# Patient Record
Sex: Female | Born: 1945
Health system: Southern US, Community
[De-identification: ages and names within clinical notes are randomized; demographics above are authoritative.]

## PROBLEM LIST (undated history)

## (undated) DIAGNOSIS — I499 Cardiac arrhythmia, unspecified: Secondary | ICD-10-CM

## (undated) DIAGNOSIS — M1990 Unspecified osteoarthritis, unspecified site: Secondary | ICD-10-CM

## (undated) HISTORY — PX: CATARACT EXTRACTION, BILATERAL: SHX1313

## (undated) HISTORY — PX: ABDOMINAL HYSTERECTOMY: SHX81

## (undated) HISTORY — PX: TUBAL LIGATION: SHX77

---

## 1998-06-06 ENCOUNTER — Ambulatory Visit (HOSPITAL_COMMUNITY): Admission: RE | Admit: 1998-06-06 | Discharge: 1998-06-06 | Payer: Self-pay | Admitting: Internal Medicine

## 1998-06-06 ENCOUNTER — Encounter: Payer: Self-pay | Admitting: Internal Medicine

## 1999-07-03 ENCOUNTER — Encounter: Payer: Self-pay | Admitting: Internal Medicine

## 1999-07-03 ENCOUNTER — Encounter: Admission: RE | Admit: 1999-07-03 | Discharge: 1999-07-03 | Payer: Self-pay | Admitting: Internal Medicine

## 2000-07-11 ENCOUNTER — Encounter: Admission: RE | Admit: 2000-07-11 | Discharge: 2000-07-11 | Payer: Self-pay | Admitting: Internal Medicine

## 2000-07-11 ENCOUNTER — Encounter: Payer: Self-pay | Admitting: Internal Medicine

## 2001-07-12 ENCOUNTER — Encounter: Admission: RE | Admit: 2001-07-12 | Discharge: 2001-07-12 | Payer: Self-pay | Admitting: Internal Medicine

## 2001-07-12 ENCOUNTER — Encounter: Payer: Self-pay | Admitting: Internal Medicine

## 2002-08-08 ENCOUNTER — Encounter: Admission: RE | Admit: 2002-08-08 | Discharge: 2002-08-08 | Payer: Self-pay | Admitting: Internal Medicine

## 2002-08-08 ENCOUNTER — Encounter: Payer: Self-pay | Admitting: Internal Medicine

## 2003-10-28 ENCOUNTER — Encounter: Admission: RE | Admit: 2003-10-28 | Discharge: 2003-10-28 | Payer: Self-pay | Admitting: Internal Medicine

## 2004-11-27 ENCOUNTER — Encounter: Admission: RE | Admit: 2004-11-27 | Discharge: 2004-11-27 | Payer: Self-pay | Admitting: Internal Medicine

## 2005-11-29 ENCOUNTER — Encounter: Admission: RE | Admit: 2005-11-29 | Discharge: 2005-11-29 | Payer: Self-pay | Admitting: Internal Medicine

## 2006-12-21 ENCOUNTER — Encounter: Admission: RE | Admit: 2006-12-21 | Discharge: 2006-12-21 | Payer: Self-pay | Admitting: Internal Medicine

## 2007-12-27 ENCOUNTER — Encounter: Admission: RE | Admit: 2007-12-27 | Discharge: 2007-12-27 | Payer: Self-pay | Admitting: Internal Medicine

## 2009-01-01 ENCOUNTER — Encounter: Admission: RE | Admit: 2009-01-01 | Discharge: 2009-01-01 | Payer: Self-pay | Admitting: Internal Medicine

## 2010-01-07 ENCOUNTER — Encounter
Admission: RE | Admit: 2010-01-07 | Discharge: 2010-01-07 | Payer: Self-pay | Source: Home / Self Care | Attending: Internal Medicine | Admitting: Internal Medicine

## 2010-12-02 ENCOUNTER — Other Ambulatory Visit: Payer: Self-pay | Admitting: Internal Medicine

## 2010-12-02 DIAGNOSIS — Z1231 Encounter for screening mammogram for malignant neoplasm of breast: Secondary | ICD-10-CM

## 2011-01-13 ENCOUNTER — Ambulatory Visit
Admission: RE | Admit: 2011-01-13 | Discharge: 2011-01-13 | Disposition: A | Discharge status: Home / Self Care | Payer: PRIVATE HEALTH INSURANCE | Source: Ambulatory Visit | Attending: Internal Medicine | Admitting: Internal Medicine

## 2011-01-13 DIAGNOSIS — Z1231 Encounter for screening mammogram for malignant neoplasm of breast: Secondary | ICD-10-CM

## 2011-01-21 ENCOUNTER — Ambulatory Visit: Payer: Self-pay

## 2011-06-18 DIAGNOSIS — Z1331 Encounter for screening for depression: Secondary | ICD-10-CM | POA: Diagnosis not present

## 2011-06-18 DIAGNOSIS — Z0000 Encounter for general adult medical examination without abnormal findings: Secondary | ICD-10-CM | POA: Diagnosis not present

## 2011-06-18 DIAGNOSIS — Z23 Encounter for immunization: Secondary | ICD-10-CM | POA: Diagnosis not present

## 2011-06-18 DIAGNOSIS — I10 Essential (primary) hypertension: Secondary | ICD-10-CM | POA: Diagnosis not present

## 2011-11-17 DIAGNOSIS — Z23 Encounter for immunization: Secondary | ICD-10-CM | POA: Diagnosis not present

## 2011-12-03 DIAGNOSIS — I10 Essential (primary) hypertension: Secondary | ICD-10-CM | POA: Diagnosis not present

## 2011-12-08 ENCOUNTER — Other Ambulatory Visit: Payer: Self-pay | Admitting: Internal Medicine

## 2011-12-08 DIAGNOSIS — Z1231 Encounter for screening mammogram for malignant neoplasm of breast: Secondary | ICD-10-CM

## 2012-01-11 DIAGNOSIS — R350 Frequency of micturition: Secondary | ICD-10-CM | POA: Diagnosis not present

## 2012-01-11 DIAGNOSIS — J029 Acute pharyngitis, unspecified: Secondary | ICD-10-CM | POA: Diagnosis not present

## 2012-01-14 ENCOUNTER — Ambulatory Visit
Admission: RE | Admit: 2012-01-14 | Discharge: 2012-01-14 | Disposition: A | Discharge status: Home / Self Care | Payer: Medicare Other | Source: Ambulatory Visit | Attending: Internal Medicine | Admitting: Internal Medicine

## 2012-01-14 DIAGNOSIS — Z1231 Encounter for screening mammogram for malignant neoplasm of breast: Secondary | ICD-10-CM | POA: Diagnosis not present

## 2012-06-01 DIAGNOSIS — I10 Essential (primary) hypertension: Secondary | ICD-10-CM | POA: Diagnosis not present

## 2012-07-18 DIAGNOSIS — Z961 Presence of intraocular lens: Secondary | ICD-10-CM | POA: Diagnosis not present

## 2012-07-18 DIAGNOSIS — H18419 Arcus senilis, unspecified eye: Secondary | ICD-10-CM | POA: Diagnosis not present

## 2012-10-18 DIAGNOSIS — Z23 Encounter for immunization: Secondary | ICD-10-CM | POA: Diagnosis not present

## 2012-12-04 DIAGNOSIS — I10 Essential (primary) hypertension: Secondary | ICD-10-CM | POA: Diagnosis not present

## 2012-12-20 ENCOUNTER — Other Ambulatory Visit: Payer: Self-pay

## 2012-12-20 DIAGNOSIS — Z1231 Encounter for screening mammogram for malignant neoplasm of breast: Secondary | ICD-10-CM

## 2013-01-19 ENCOUNTER — Ambulatory Visit
Admission: RE | Admit: 2013-01-19 | Discharge: 2013-01-19 | Disposition: A | Discharge status: Home / Self Care | Payer: Medicare Other | Source: Ambulatory Visit

## 2013-01-19 ENCOUNTER — Other Ambulatory Visit: Payer: Self-pay

## 2013-01-19 DIAGNOSIS — Z1231 Encounter for screening mammogram for malignant neoplasm of breast: Secondary | ICD-10-CM

## 2013-06-04 DIAGNOSIS — I10 Essential (primary) hypertension: Secondary | ICD-10-CM | POA: Diagnosis not present

## 2013-06-04 DIAGNOSIS — R6883 Chills (without fever): Secondary | ICD-10-CM | POA: Diagnosis not present

## 2013-06-04 DIAGNOSIS — Z0000 Encounter for general adult medical examination without abnormal findings: Secondary | ICD-10-CM | POA: Diagnosis not present

## 2013-06-04 DIAGNOSIS — Z23 Encounter for immunization: Secondary | ICD-10-CM | POA: Diagnosis not present

## 2013-06-22 DIAGNOSIS — Z780 Asymptomatic menopausal state: Secondary | ICD-10-CM | POA: Diagnosis not present

## 2013-07-17 DIAGNOSIS — H02839 Dermatochalasis of unspecified eye, unspecified eyelid: Secondary | ICD-10-CM | POA: Diagnosis not present

## 2013-07-17 DIAGNOSIS — H18419 Arcus senilis, unspecified eye: Secondary | ICD-10-CM | POA: Diagnosis not present

## 2013-07-17 DIAGNOSIS — H04129 Dry eye syndrome of unspecified lacrimal gland: Secondary | ICD-10-CM | POA: Diagnosis not present

## 2013-07-17 DIAGNOSIS — Z961 Presence of intraocular lens: Secondary | ICD-10-CM | POA: Diagnosis not present

## 2013-10-24 DIAGNOSIS — Z23 Encounter for immunization: Secondary | ICD-10-CM | POA: Diagnosis not present

## 2013-11-26 DIAGNOSIS — I10 Essential (primary) hypertension: Secondary | ICD-10-CM | POA: Diagnosis not present

## 2013-11-26 DIAGNOSIS — R079 Chest pain, unspecified: Secondary | ICD-10-CM | POA: Diagnosis not present

## 2013-12-24 ENCOUNTER — Other Ambulatory Visit: Payer: Self-pay

## 2013-12-24 DIAGNOSIS — Z1231 Encounter for screening mammogram for malignant neoplasm of breast: Secondary | ICD-10-CM

## 2014-01-21 ENCOUNTER — Ambulatory Visit
Admission: RE | Admit: 2014-01-21 | Discharge: 2014-01-21 | Disposition: A | Discharge status: Home / Self Care | Payer: Medicare Other | Source: Ambulatory Visit

## 2014-01-21 DIAGNOSIS — Z1231 Encounter for screening mammogram for malignant neoplasm of breast: Secondary | ICD-10-CM | POA: Diagnosis not present

## 2014-06-13 DIAGNOSIS — E669 Obesity, unspecified: Secondary | ICD-10-CM | POA: Diagnosis not present

## 2014-06-13 DIAGNOSIS — Z23 Encounter for immunization: Secondary | ICD-10-CM | POA: Diagnosis not present

## 2014-06-13 DIAGNOSIS — I10 Essential (primary) hypertension: Secondary | ICD-10-CM | POA: Diagnosis not present

## 2014-06-13 DIAGNOSIS — Z6833 Body mass index (BMI) 33.0-33.9, adult: Secondary | ICD-10-CM | POA: Diagnosis not present

## 2014-06-13 DIAGNOSIS — Z1389 Encounter for screening for other disorder: Secondary | ICD-10-CM | POA: Diagnosis not present

## 2014-07-23 DIAGNOSIS — I10 Essential (primary) hypertension: Secondary | ICD-10-CM | POA: Diagnosis not present

## 2014-07-23 DIAGNOSIS — Z961 Presence of intraocular lens: Secondary | ICD-10-CM | POA: Diagnosis not present

## 2014-07-23 DIAGNOSIS — H18413 Arcus senilis, bilateral: Secondary | ICD-10-CM | POA: Diagnosis not present

## 2014-07-23 DIAGNOSIS — H43811 Vitreous degeneration, right eye: Secondary | ICD-10-CM | POA: Diagnosis not present

## 2014-10-23 DIAGNOSIS — Z23 Encounter for immunization: Secondary | ICD-10-CM | POA: Diagnosis not present

## 2014-12-16 DIAGNOSIS — I10 Essential (primary) hypertension: Secondary | ICD-10-CM | POA: Diagnosis not present

## 2014-12-16 DIAGNOSIS — E669 Obesity, unspecified: Secondary | ICD-10-CM | POA: Diagnosis not present

## 2014-12-16 DIAGNOSIS — Z6834 Body mass index (BMI) 34.0-34.9, adult: Secondary | ICD-10-CM | POA: Diagnosis not present

## 2015-01-09 ENCOUNTER — Other Ambulatory Visit: Payer: Self-pay

## 2015-01-09 DIAGNOSIS — Z1231 Encounter for screening mammogram for malignant neoplasm of breast: Secondary | ICD-10-CM

## 2015-02-06 ENCOUNTER — Ambulatory Visit
Admission: RE | Admit: 2015-02-06 | Discharge: 2015-02-06 | Disposition: A | Discharge status: Home / Self Care | Payer: Medicare Other | Source: Ambulatory Visit

## 2015-02-06 DIAGNOSIS — Z1231 Encounter for screening mammogram for malignant neoplasm of breast: Secondary | ICD-10-CM | POA: Diagnosis not present

## 2015-06-18 DIAGNOSIS — Z6834 Body mass index (BMI) 34.0-34.9, adult: Secondary | ICD-10-CM | POA: Diagnosis not present

## 2015-06-18 DIAGNOSIS — I10 Essential (primary) hypertension: Secondary | ICD-10-CM | POA: Diagnosis not present

## 2015-06-18 DIAGNOSIS — Z0000 Encounter for general adult medical examination without abnormal findings: Secondary | ICD-10-CM | POA: Diagnosis not present

## 2015-06-18 DIAGNOSIS — E669 Obesity, unspecified: Secondary | ICD-10-CM | POA: Diagnosis not present

## 2015-06-18 DIAGNOSIS — Z1389 Encounter for screening for other disorder: Secondary | ICD-10-CM | POA: Diagnosis not present

## 2015-06-18 DIAGNOSIS — Z1211 Encounter for screening for malignant neoplasm of colon: Secondary | ICD-10-CM | POA: Diagnosis not present

## 2015-07-10 ENCOUNTER — Other Ambulatory Visit: Payer: Self-pay | Admitting: Gastroenterology

## 2015-08-05 DIAGNOSIS — H18413 Arcus senilis, bilateral: Secondary | ICD-10-CM | POA: Diagnosis not present

## 2015-08-05 DIAGNOSIS — H43811 Vitreous degeneration, right eye: Secondary | ICD-10-CM | POA: Diagnosis not present

## 2015-08-05 DIAGNOSIS — I10 Essential (primary) hypertension: Secondary | ICD-10-CM | POA: Diagnosis not present

## 2015-08-05 DIAGNOSIS — Z961 Presence of intraocular lens: Secondary | ICD-10-CM | POA: Diagnosis not present

## 2015-08-06 ENCOUNTER — Encounter (HOSPITAL_COMMUNITY): Payer: Self-pay | Admitting: *Deleted

## 2015-08-12 ENCOUNTER — Ambulatory Visit (HOSPITAL_COMMUNITY): Payer: Medicare Other | Admitting: Anesthesiology

## 2015-08-12 ENCOUNTER — Encounter (HOSPITAL_COMMUNITY)
Admission: RE | Disposition: A | Discharge status: Home / Self Care | Payer: Self-pay | Source: Ambulatory Visit | Attending: Gastroenterology

## 2015-08-12 ENCOUNTER — Encounter (HOSPITAL_COMMUNITY): Payer: Self-pay | Admitting: Anesthesiology

## 2015-08-12 ENCOUNTER — Ambulatory Visit (HOSPITAL_COMMUNITY)
Admission: RE | Admit: 2015-08-12 | Discharge: 2015-08-12 | Disposition: A | Discharge status: Home / Self Care | Payer: Medicare Other | Source: Ambulatory Visit | Attending: Gastroenterology | Admitting: Gastroenterology

## 2015-08-12 DIAGNOSIS — Z1211 Encounter for screening for malignant neoplasm of colon: Secondary | ICD-10-CM | POA: Diagnosis not present

## 2015-08-12 DIAGNOSIS — Z87891 Personal history of nicotine dependence: Secondary | ICD-10-CM | POA: Insufficient documentation

## 2015-08-12 DIAGNOSIS — I10 Essential (primary) hypertension: Secondary | ICD-10-CM | POA: Diagnosis not present

## 2015-08-12 DIAGNOSIS — K5790 Diverticulosis of intestine, part unspecified, without perforation or abscess without bleeding: Secondary | ICD-10-CM | POA: Diagnosis not present

## 2015-08-12 HISTORY — DX: Unspecified osteoarthritis, unspecified site: M19.90

## 2015-08-12 HISTORY — PX: COLONOSCOPY WITH PROPOFOL: SHX5780

## 2015-08-12 HISTORY — DX: Cardiac arrhythmia, unspecified: I49.9

## 2015-08-12 SURGERY — COLONOSCOPY WITH PROPOFOL
Anesthesia: Monitor Anesthesia Care

## 2015-08-12 MED ORDER — PROPOFOL 10 MG/ML IV BOLUS
INTRAVENOUS | Status: AC
  Filled 2015-08-12: qty 40

## 2015-08-12 MED ORDER — PROPOFOL 500 MG/50ML IV EMUL
INTRAVENOUS | Status: DC | PRN
Start: 2015-08-12 — End: 2015-08-12
  Administered 2015-08-12: 30 mg via INTRAVENOUS

## 2015-08-12 MED ORDER — PROPOFOL 500 MG/50ML IV EMUL
INTRAVENOUS | Status: DC | PRN
  Administered 2015-08-12: 125 ug/kg/min via INTRAVENOUS

## 2015-08-12 MED ORDER — SODIUM CHLORIDE 0.9 % IV SOLN: INTRAVENOUS | Status: DC

## 2015-08-12 MED ORDER — EPHEDRINE SULFATE 50 MG/ML IJ SOLN
INTRAMUSCULAR | Status: DC | PRN
  Administered 2015-08-12: 5 mg via INTRAVENOUS

## 2015-08-12 MED ORDER — LACTATED RINGERS IV SOLN
INTRAVENOUS | Status: DC
  Administered 2015-08-12: 1000 mL via INTRAVENOUS

## 2015-08-12 SURGICAL SUPPLY — 22 items

## 2015-08-12 NOTE — Anesthesia Postprocedure Evaluation (Signed)
Anesthesia Post Note  Patient: Jean Brennan  Procedure(s) Performed: Procedure(s) (LRB): COLONOSCOPY WITH PROPOFOL (N/A)  Patient location during evaluation: PACU Anesthesia Type: MAC Level of consciousness: awake and alert Pain management: pain level controlled Vital Signs Assessment: post-procedure vital signs reviewed and stable Respiratory status: spontaneous breathing, nonlabored ventilation, respiratory function stable and patient connected to nasal cannula oxygen Cardiovascular status: stable and blood pressure returned to baseline Anesthetic complications: no    Last Vitals:  Vitals:   08/12/15 1030 08/12/15 1133  BP: (!) 180/90 107/65  Pulse: 61 65  Resp: (!) 8 14  Temp: 36.7 C     Last Pain:  Vitals:   08/12/15 1030  TempSrc: Oral  PainSc: 5                  [name]

## 2015-08-12 NOTE — Transfer of Care (Signed)
Immediate Anesthesia Transfer of Care Note  Patient: Jean Brennan  Procedure(s) Performed: Procedure(s): COLONOSCOPY WITH PROPOFOL (N/A)  Patient Location: PACU  Anesthesia Type:MAC  Level of Consciousness:  sedated, patient cooperative and responds to stimulation  Airway & Oxygen Therapy:Patient Spontanous Breathing and Patient connected to face mask oxgen  Post-op Assessment:  Report given to PACU RN and Post -op Vital signs reviewed and stable  Post vital signs:  Reviewed and stable  Last Vitals:  Vitals:   08/12/15 1030  BP: (!) 180/90  Pulse: 61  Resp: (!) 8  Temp: 123XX123 C    Complications: No apparent anesthesia complications

## 2015-08-12 NOTE — Anesthesia Preprocedure Evaluation (Signed)
Anesthesia Evaluation  Patient identified by MRN, date of birth, ID band Patient awake    Reviewed: Allergy & Precautions, NPO status , Patient's Chart, lab work & pertinent test results  Airway Mallampati: II  TM Distance: >3 FB Neck ROM: Full    Dental no notable dental hx.    Pulmonary neg pulmonary ROS, former smoker,    Pulmonary exam normal breath sounds clear to auscultation       Cardiovascular negative cardio ROS Normal cardiovascular exam Rhythm:Regular Rate:Normal     Neuro/Psych negative neurological ROS  negative psych ROS   GI/Hepatic negative GI ROS, Neg liver ROS,   Endo/Other  negative endocrine ROS  Renal/GU negative Renal ROS  negative genitourinary   Musculoskeletal negative musculoskeletal ROS (+)   Abdominal   Peds negative pediatric ROS (+)  Hematology negative hematology ROS (+)   Anesthesia Other Findings   Reproductive/Obstetrics negative OB ROS                             Anesthesia Physical Anesthesia Plan  ASA: II  Anesthesia Plan: MAC   Post-op Pain Management:    Induction: Intravenous  Airway Management Planned: Simple Face Mask  Additional Equipment:   Intra-op Plan:   Post-operative Plan:   Informed Consent: I have reviewed the patients History and Physical, chart, labs and discussed the procedure including the risks, benefits and alternatives for the proposed anesthesia with the patient or authorized representative who has indicated his/her understanding and acceptance.   Dental advisory given  Plan Discussed with: CRNA and Surgeon  Anesthesia Plan Comments:         Anesthesia Quick Evaluation

## 2015-08-12 NOTE — Discharge Instructions (Signed)

## 2015-08-12 NOTE — H&P (Signed)
  Procedure: Screening colonoscopy. Normal screening colonoscopy was performed on 03/08/2005  History: The patient is a 70 year old female born [birth year]. She is scheduled to undergo a repeat screening colonoscopy today. There is no family history of colon cancer.  Past medical history: Bilateral cataract surgery. Hemorrhoidectomy. Total abdominal hysterectomy to treat bleeding in 1972. Hypertension. Left shoulder lipoma. Stress urinary incontinence.  Medication allergies: None  Exam: Patient is alert and lying comfortably on the endoscopy stretcher. Cardiac exam reveals a regular rhythm. Lungs are clear to auscultation. Abdomen is soft and nontender to palpation.  Plan: Proceed with screening colonoscopy

## 2015-08-12 NOTE — Op Note (Signed)
[facility] Patient Name: Jean Brennan Procedure Date: 08/12/2015 MRN: [id number] Attending MD: Garlan Fair , MD Date of Birth: 05/18/1945 CSN: [id number] Age: 70 Admit Type: Outpatient Procedure:                Colonoscopy Indications:              Screening for colorectal malignant neoplasm Providers:                Garlan Fair, MD, Dustin Flock RN, RN, Cletis Athens, Technician Referring MD:              Medicines:                Propofol per Anesthesia Complications:            No immediate complications. Estimated Blood Loss:     Estimated blood loss: none. Procedure:                Pre-Anesthesia Assessment:                           - Prior to the procedure, a History and Physical                            was performed, and patient medications and                            allergies were reviewed. The patient's tolerance of                            previous anesthesia was also reviewed. The risks                            and benefits of the procedure and the sedation                            options and risks were discussed with the patient.                            All questions were answered, and informed consent                            was obtained. Prior Anticoagulants: The patient has                            taken no previous anticoagulant or antiplatelet                            agents. ASA Grade Assessment: II - A patient with                            mild systemic disease. After reviewing the risks  and benefits, the patient was deemed in                            satisfactory condition to undergo the procedure.                           After obtaining informed consent, the colonoscope                            was passed under direct vision. Throughout the                            procedure, the patient's blood pressure, pulse, and   oxygen saturations were monitored continuously. The                            [id number]) scope was introduced through                            the anus and advanced to the the cecum, identified                            by appendiceal orifice and ileocecal valve. The                            colonoscopy was performed without difficulty. The                            patient tolerated the procedure well. The quality                            of the bowel preparation was good. The appendiceal                            orifice and the rectum were photographed. Findings:      The perianal and digital rectal examinations were normal.      The entire examined colon appeared normal. Impression:               - The entire examined colon is normal.                           - No specimens collected. Moderate Sedation:      N/A- Per Anesthesia Care Recommendation:           - Patient has a contact number available for                            emergencies. The signs and symptoms of potential                            delayed complications were discussed with the                            patient. Return to normal activities tomorrow.  Written discharge instructions were provided to the                            patient.                           - Repeat colonoscopy is not recommended for                            screening purposes.                           - Resume previous diet.                           - Continue present medications. Procedure Code(s):        --- Professional ---                           [id number], Colonoscopy, flexible; diagnostic, including                            collection of specimen(s) by brushing or washing,                            when performed (separate procedure) Diagnosis Code(s):        --- Professional ---                           Z12.11, Encounter for screening for malignant                             neoplasm of colon CPT copyright 2016 [employer]. All rights reserved. The codes documented in this report are preliminary and upon coder review may  be revised to meet current compliance requirements. Earle Gell, MD Garlan Fair, MD 08/12/2015 11:33:04 AM This report has been signed electronically. Number of Addenda: 0

## 2015-08-13 ENCOUNTER — Encounter (HOSPITAL_COMMUNITY): Payer: Self-pay | Admitting: Gastroenterology

## 2015-10-15 DIAGNOSIS — Z23 Encounter for immunization: Secondary | ICD-10-CM | POA: Diagnosis not present

## 2015-12-19 DIAGNOSIS — I10 Essential (primary) hypertension: Secondary | ICD-10-CM | POA: Diagnosis not present

## 2016-01-08 ENCOUNTER — Other Ambulatory Visit: Payer: Self-pay | Admitting: Internal Medicine

## 2016-01-08 DIAGNOSIS — Z1231 Encounter for screening mammogram for malignant neoplasm of breast: Secondary | ICD-10-CM

## 2016-02-09 ENCOUNTER — Ambulatory Visit
Admission: RE | Admit: 2016-02-09 | Discharge: 2016-02-09 | Disposition: A | Discharge status: Home / Self Care | Payer: Medicare Other | Source: Ambulatory Visit | Attending: Internal Medicine | Admitting: Internal Medicine

## 2016-02-09 DIAGNOSIS — Z1231 Encounter for screening mammogram for malignant neoplasm of breast: Secondary | ICD-10-CM

## 2016-07-09 ENCOUNTER — Other Ambulatory Visit: Payer: Self-pay | Admitting: Internal Medicine

## 2016-07-09 ENCOUNTER — Ambulatory Visit
Admission: RE | Admit: 2016-07-09 | Discharge: 2016-07-09 | Disposition: A | Discharge status: Home / Self Care | Payer: Medicare Other | Source: Ambulatory Visit | Attending: Internal Medicine | Admitting: Internal Medicine

## 2016-07-09 DIAGNOSIS — E669 Obesity, unspecified: Secondary | ICD-10-CM | POA: Diagnosis not present

## 2016-07-09 DIAGNOSIS — Z6834 Body mass index (BMI) 34.0-34.9, adult: Secondary | ICD-10-CM | POA: Diagnosis not present

## 2016-07-09 DIAGNOSIS — Z0000 Encounter for general adult medical examination without abnormal findings: Secondary | ICD-10-CM | POA: Diagnosis not present

## 2016-07-09 DIAGNOSIS — M25511 Pain in right shoulder: Secondary | ICD-10-CM | POA: Diagnosis not present

## 2016-07-09 DIAGNOSIS — G8929 Other chronic pain: Secondary | ICD-10-CM | POA: Diagnosis not present

## 2016-07-09 DIAGNOSIS — I10 Essential (primary) hypertension: Secondary | ICD-10-CM | POA: Diagnosis not present

## 2016-07-09 DIAGNOSIS — Z136 Encounter for screening for cardiovascular disorders: Secondary | ICD-10-CM | POA: Diagnosis not present

## 2016-07-09 DIAGNOSIS — Z1389 Encounter for screening for other disorder: Secondary | ICD-10-CM | POA: Diagnosis not present

## 2016-07-09 DIAGNOSIS — M19011 Primary osteoarthritis, right shoulder: Secondary | ICD-10-CM | POA: Diagnosis not present

## 2016-07-12 DIAGNOSIS — M25511 Pain in right shoulder: Secondary | ICD-10-CM | POA: Diagnosis not present

## 2016-07-12 DIAGNOSIS — M66321 Spontaneous rupture of flexor tendons, right upper arm: Secondary | ICD-10-CM | POA: Diagnosis not present

## 2016-07-12 DIAGNOSIS — M75121 Complete rotator cuff tear or rupture of right shoulder, not specified as traumatic: Secondary | ICD-10-CM | POA: Diagnosis not present

## 2016-08-02 DIAGNOSIS — R2231 Localized swelling, mass and lump, right upper limb: Secondary | ICD-10-CM | POA: Diagnosis not present

## 2016-08-02 DIAGNOSIS — M75121 Complete rotator cuff tear or rupture of right shoulder, not specified as traumatic: Secondary | ICD-10-CM | POA: Diagnosis not present

## 2016-08-10 DIAGNOSIS — Z961 Presence of intraocular lens: Secondary | ICD-10-CM | POA: Diagnosis not present

## 2016-08-10 DIAGNOSIS — I10 Essential (primary) hypertension: Secondary | ICD-10-CM | POA: Diagnosis not present

## 2016-08-10 DIAGNOSIS — H18413 Arcus senilis, bilateral: Secondary | ICD-10-CM | POA: Diagnosis not present

## 2016-08-10 DIAGNOSIS — H43813 Vitreous degeneration, bilateral: Secondary | ICD-10-CM | POA: Diagnosis not present

## 2016-10-20 DIAGNOSIS — Z23 Encounter for immunization: Secondary | ICD-10-CM | POA: Diagnosis not present

## 2017-01-04 ENCOUNTER — Other Ambulatory Visit: Payer: Self-pay | Admitting: Nurse Practitioner

## 2017-01-04 DIAGNOSIS — R079 Chest pain, unspecified: Secondary | ICD-10-CM

## 2017-01-04 DIAGNOSIS — I10 Essential (primary) hypertension: Secondary | ICD-10-CM | POA: Diagnosis not present

## 2017-01-06 ENCOUNTER — Ambulatory Visit (INDEPENDENT_AMBULATORY_CARE_PROVIDER_SITE_OTHER): Payer: Medicare Other

## 2017-01-06 DIAGNOSIS — R079 Chest pain, unspecified: Secondary | ICD-10-CM

## 2017-01-06 LAB — EXERCISE TOLERANCE TEST
CHL CUP MPHR: 150 {beats}/min
CSEPED: 6 min
CSEPEDS: 0 s
CSEPEW: 7 METS
CSEPHR: 102 %
CSEPPHR: 153 {beats}/min
RPE: 16
Rest HR: 78 {beats}/min

## 2017-01-28 ENCOUNTER — Other Ambulatory Visit: Payer: Self-pay | Admitting: Internal Medicine

## 2017-01-28 DIAGNOSIS — Z1231 Encounter for screening mammogram for malignant neoplasm of breast: Secondary | ICD-10-CM

## 2017-02-18 ENCOUNTER — Ambulatory Visit
Admission: RE | Admit: 2017-02-18 | Discharge: 2017-02-18 | Disposition: A | Discharge status: Home / Self Care | Payer: Medicare Other | Source: Ambulatory Visit | Attending: Internal Medicine | Admitting: Internal Medicine

## 2017-02-18 DIAGNOSIS — Z1231 Encounter for screening mammogram for malignant neoplasm of breast: Secondary | ICD-10-CM

## 2017-02-21 ENCOUNTER — Other Ambulatory Visit: Payer: Self-pay | Admitting: Internal Medicine

## 2017-02-21 DIAGNOSIS — R928 Other abnormal and inconclusive findings on diagnostic imaging of breast: Secondary | ICD-10-CM

## 2017-02-23 ENCOUNTER — Ambulatory Visit
Admission: RE | Admit: 2017-02-23 | Discharge: 2017-02-23 | Disposition: A | Discharge status: Home / Self Care | Payer: Medicare Other | Source: Ambulatory Visit | Attending: Internal Medicine | Admitting: Internal Medicine

## 2017-02-23 ENCOUNTER — Other Ambulatory Visit: Payer: Medicare Other

## 2017-02-23 DIAGNOSIS — R922 Inconclusive mammogram: Secondary | ICD-10-CM | POA: Diagnosis not present

## 2017-02-23 DIAGNOSIS — N6489 Other specified disorders of breast: Secondary | ICD-10-CM | POA: Diagnosis not present

## 2017-02-23 DIAGNOSIS — R928 Other abnormal and inconclusive findings on diagnostic imaging of breast: Secondary | ICD-10-CM

## 2017-02-25 ENCOUNTER — Other Ambulatory Visit: Payer: Medicare Other

## 2017-07-14 DIAGNOSIS — E669 Obesity, unspecified: Secondary | ICD-10-CM | POA: Diagnosis not present

## 2017-07-14 DIAGNOSIS — Z1389 Encounter for screening for other disorder: Secondary | ICD-10-CM | POA: Diagnosis not present

## 2017-07-14 DIAGNOSIS — Z0000 Encounter for general adult medical examination without abnormal findings: Secondary | ICD-10-CM | POA: Diagnosis not present

## 2017-07-14 DIAGNOSIS — I10 Essential (primary) hypertension: Secondary | ICD-10-CM | POA: Diagnosis not present

## 2017-07-14 DIAGNOSIS — Z6833 Body mass index (BMI) 33.0-33.9, adult: Secondary | ICD-10-CM | POA: Diagnosis not present

## 2017-08-23 DIAGNOSIS — H18413 Arcus senilis, bilateral: Secondary | ICD-10-CM | POA: Diagnosis not present

## 2017-08-23 DIAGNOSIS — H04123 Dry eye syndrome of bilateral lacrimal glands: Secondary | ICD-10-CM | POA: Diagnosis not present

## 2017-08-23 DIAGNOSIS — I10 Essential (primary) hypertension: Secondary | ICD-10-CM | POA: Diagnosis not present

## 2017-08-23 DIAGNOSIS — H43813 Vitreous degeneration, bilateral: Secondary | ICD-10-CM | POA: Diagnosis not present

## 2017-10-04 DIAGNOSIS — Z23 Encounter for immunization: Secondary | ICD-10-CM | POA: Diagnosis not present

## 2017-10-04 DIAGNOSIS — R42 Dizziness and giddiness: Secondary | ICD-10-CM | POA: Diagnosis not present

## 2017-10-04 DIAGNOSIS — H6121 Impacted cerumen, right ear: Secondary | ICD-10-CM | POA: Diagnosis not present

## 2018-01-02 DIAGNOSIS — I10 Essential (primary) hypertension: Secondary | ICD-10-CM | POA: Diagnosis not present

## 2018-01-17 ENCOUNTER — Other Ambulatory Visit: Payer: Self-pay | Admitting: Internal Medicine

## 2018-01-17 DIAGNOSIS — Z1231 Encounter for screening mammogram for malignant neoplasm of breast: Secondary | ICD-10-CM

## 2018-02-20 ENCOUNTER — Ambulatory Visit
Admission: RE | Admit: 2018-02-20 | Discharge: 2018-02-20 | Disposition: A | Discharge status: Home / Self Care | Payer: Medicare Other | Source: Ambulatory Visit | Attending: Internal Medicine | Admitting: Internal Medicine

## 2018-02-20 DIAGNOSIS — Z1231 Encounter for screening mammogram for malignant neoplasm of breast: Secondary | ICD-10-CM

## 2018-07-31 DIAGNOSIS — Z0000 Encounter for general adult medical examination without abnormal findings: Secondary | ICD-10-CM | POA: Diagnosis not present

## 2018-07-31 DIAGNOSIS — Z1389 Encounter for screening for other disorder: Secondary | ICD-10-CM | POA: Diagnosis not present

## 2018-08-25 DIAGNOSIS — I10 Essential (primary) hypertension: Secondary | ICD-10-CM | POA: Diagnosis not present

## 2018-08-25 DIAGNOSIS — Z1159 Encounter for screening for other viral diseases: Secondary | ICD-10-CM | POA: Diagnosis not present

## 2018-08-25 DIAGNOSIS — M5432 Sciatica, left side: Secondary | ICD-10-CM | POA: Diagnosis not present

## 2018-08-29 ENCOUNTER — Emergency Department (HOSPITAL_COMMUNITY): Payer: Medicare Other

## 2018-08-29 ENCOUNTER — Emergency Department (HOSPITAL_COMMUNITY)
Admission: EM | Admit: 2018-08-29 | Discharge: 2018-08-29 | Disposition: A | Discharge status: Home / Self Care | Payer: Medicare Other | Attending: Emergency Medicine | Admitting: Emergency Medicine

## 2018-08-29 ENCOUNTER — Other Ambulatory Visit: Payer: Self-pay

## 2018-08-29 ENCOUNTER — Encounter (HOSPITAL_COMMUNITY): Payer: Self-pay | Admitting: Emergency Medicine

## 2018-08-29 DIAGNOSIS — R000 Tachycardia, unspecified: Secondary | ICD-10-CM | POA: Diagnosis not present

## 2018-08-29 DIAGNOSIS — R0789 Other chest pain: Secondary | ICD-10-CM | POA: Insufficient documentation

## 2018-08-29 DIAGNOSIS — R079 Chest pain, unspecified: Secondary | ICD-10-CM | POA: Diagnosis not present

## 2018-08-29 DIAGNOSIS — R0902 Hypoxemia: Secondary | ICD-10-CM | POA: Diagnosis not present

## 2018-08-29 DIAGNOSIS — R61 Generalized hyperhidrosis: Secondary | ICD-10-CM | POA: Diagnosis not present

## 2018-08-29 DIAGNOSIS — R42 Dizziness and giddiness: Secondary | ICD-10-CM | POA: Insufficient documentation

## 2018-08-29 DIAGNOSIS — H18413 Arcus senilis, bilateral: Secondary | ICD-10-CM | POA: Diagnosis not present

## 2018-08-29 DIAGNOSIS — H43813 Vitreous degeneration, bilateral: Secondary | ICD-10-CM | POA: Diagnosis not present

## 2018-08-29 DIAGNOSIS — Z79899 Other long term (current) drug therapy: Secondary | ICD-10-CM | POA: Insufficient documentation

## 2018-08-29 DIAGNOSIS — Z87891 Personal history of nicotine dependence: Secondary | ICD-10-CM | POA: Insufficient documentation

## 2018-08-29 DIAGNOSIS — H04123 Dry eye syndrome of bilateral lacrimal glands: Secondary | ICD-10-CM | POA: Diagnosis not present

## 2018-08-29 DIAGNOSIS — R0600 Dyspnea, unspecified: Secondary | ICD-10-CM | POA: Diagnosis not present

## 2018-08-29 DIAGNOSIS — R0602 Shortness of breath: Secondary | ICD-10-CM | POA: Diagnosis not present

## 2018-08-29 DIAGNOSIS — I10 Essential (primary) hypertension: Secondary | ICD-10-CM | POA: Insufficient documentation

## 2018-08-29 DIAGNOSIS — Z961 Presence of intraocular lens: Secondary | ICD-10-CM | POA: Diagnosis not present

## 2018-08-29 LAB — TROPONIN I (HIGH SENSITIVITY)
Troponin I (High Sensitivity): 3 ng/L (ref <18)
Troponin I (High Sensitivity): 4 ng/L (ref <18)

## 2018-08-29 LAB — CBC
HCT: 37 % (ref 36.0–46.0)
Hemoglobin: 12.5 g/dL (ref 12.0–15.0)
MCH: 30.3 pg (ref 26.0–34.0)
MCHC: 33.8 g/dL (ref 30.0–36.0)
MCV: 89.8 fL (ref 80.0–100.0)
Platelets: 330 10*3/uL (ref 150–400)
RBC: 4.12 MIL/uL (ref 3.87–5.11)
RDW: 13.9 % (ref 11.5–15.5)
WBC: 9.5 10*3/uL (ref 4.0–10.5)
nRBC: 0 % (ref 0.0–0.2)

## 2018-08-29 LAB — BASIC METABOLIC PANEL
Anion gap: 8 (ref 5–15)
BUN: 22 mg/dL (ref 8–23)
CO2: 28 mmol/L (ref 22–32)
Calcium: 9.5 mg/dL (ref 8.9–10.3)
Chloride: 106 mmol/L (ref 98–111)
Creatinine, Ser: 0.92 mg/dL (ref 0.44–1.00)
GFR calc Af Amer: >60 mL/min (ref >60)
GFR calc non Af Amer: >60 mL/min (ref >60)
Glucose, Bld: 105 mg/dL — ABNORMAL HIGH (ref 70–99)
Potassium: 3.7 mmol/L (ref 3.5–5.1)
Sodium: 142 mmol/L (ref 135–145)

## 2018-08-29 MED ORDER — SODIUM CHLORIDE 0.9% FLUSH: 3.0000 mL | Freq: Once | INTRAVENOUS | Status: DC

## 2018-08-29 NOTE — ED Provider Notes (Signed)
[facility] EMERGENCY DEPARTMENT Provider Note   CSN: [id number] Arrival date & time: 08/29/18  1002     History   Chief Complaint Chief Complaint  Patient presents with  . Chest Pain    HPI Darah Simkin is a 73 y.o. female.     HPI  73 year old female presents with chest pain.  She was at the eye doctor's office and doing an eye exam when she felt sudden onset chest tightness/throbbing, dyspnea, sweating and lightheadedness, and nausea.  Her arms felt tingly.  Overall lasted about 10 minutes.  They checked her blood pressure and it was elevated.  She has not taken her hydrochlorothiazide this morning as she typically takes it later in the day.  EMS gave aspirin and at one point that her blood pressure was 347 systolic.  She otherwise is asymptomatic besides feeling a little shaky.  She has a history of hypertension but denies hyperlipidemia, diabetes, smoking or cardiac history. No leg swelling.  Past Medical History:  Diagnosis Date  . Arthritis    shoulders and left hip  . Dysrhythmia     There are no active problems to display for this patient.   Past Surgical History:  Procedure Laterality Date  . ABDOMINAL HYSTERECTOMY     '78 Hysterectomy  . CATARACT EXTRACTION, BILATERAL Bilateral   . COLONOSCOPY WITH PROPOFOL N/A 08/12/2015   Procedure: COLONOSCOPY WITH PROPOFOL;  Surgeon: Garlan Fair, MD;  Location: [facility];  Service: Endoscopy;  Laterality: N/A;  . TUBAL LIGATION       OB History   No obstetric history on file.      Home Medications    Prior to Admission medications   Medication Sig Start Date End Date Taking? Authorizing Provider  BIOTIN 5000 PO Take 1 tablet by mouth daily.   Yes [provider]  hydrochlorothiazide (MICROZIDE) 12.5 MG capsule Take 1 capsule by mouth daily. 05/20/15  Yes [provider]  Multiple Vitamins-Minerals (CENTRUM SILVER 50+WOMEN) TABS Take 1 tablet by mouth daily.   Yes  [provider]  predniSONE (DELTASONE) 20 MG tablet Take 10-60 mg by mouth See admin instructions. Take 3 tablets (60mg ) by mouth once daily for 2 days, 2 tablets (40mg ) by mouth once daily for 2 days, 1 tablet (20mg ) by mouth for 2 days, then 1/2 tablet (20mg ) ONCE 08/25/18  Yes [provider]  famotidine-calcium carbonate-magnesium hydroxide (PEPCID COMPLETE) 10-800-165 MG chewable tablet Chew 1 tablet by mouth daily as needed (heartburn).    [provider]  Multiple Vitamin (MULTIVITAMIN WITH MINERALS) TABS tablet Take 1 tablet by mouth daily.    [provider]  naproxen sodium (ANAPROX) 220 MG tablet Take 220 mg by mouth 2 (two) times daily as needed.    [provider]    Family History History reviewed. No pertinent family history.  Social History Social History   Tobacco Use  . Smoking status: Former Smoker    Types: Cigarettes    Quit date: 08/05/1973    Years since quitting: 45.0  . Smokeless tobacco: Never Used  Substance Use Topics  . Alcohol use: Yes    Comment: occ to rare  . Drug use: No     Allergies   Prednisone   Review of Systems Review of Systems  Respiratory: Positive for shortness of breath.   Cardiovascular: Positive for chest pain. Negative for leg swelling.  Gastrointestinal: Positive for nausea. Negative for vomiting.  Neurological: Positive for light-headedness.  All other  systems reviewed and are negative.    Physical Exam Updated Vital Signs Ht 5\' 3"  (1.6 m)   Wt 85.3 kg   BMI 33.30 kg/m   Physical Exam Vitals signs and nursing note reviewed.  Constitutional:      General: She is not in acute distress.    Appearance: She is well-developed. She is not ill-appearing or diaphoretic.  HENT:     Head: Normocephalic and atraumatic.     Right Ear: External ear normal.     Left Ear: External ear normal.     Nose: Nose normal.  Eyes:     General:        Right eye: No discharge.        Left  eye: No discharge.  Cardiovascular:     Rate and Rhythm: Normal rate and regular rhythm.     Heart sounds: Normal heart sounds.  Pulmonary:     Effort: Pulmonary effort is normal.     Breath sounds: Normal breath sounds.  Abdominal:     Palpations: Abdomen is soft.     Tenderness: There is no abdominal tenderness.  Musculoskeletal:     Right lower leg: No edema.     Left lower leg: No edema.  Skin:    General: Skin is warm and dry.  Neurological:     Mental Status: She is alert.  Psychiatric:        Mood and Affect: Mood is not anxious.      ED Treatments / Results  Labs (all labs ordered are listed, but only abnormal results are displayed) Labs Reviewed  BASIC METABOLIC PANEL - Abnormal; Notable for the following components:      Result Value   Glucose, Bld 105 (*)    All other components within normal limits  CBC  TROPONIN I (HIGH SENSITIVITY)  TROPONIN I (HIGH SENSITIVITY)    EKG EKG Interpretation  Date/Time:  Tuesday August 29 2018 10:08:58 EDT Ventricular Rate:  62 PR Interval:    QRS Duration: 91 QT Interval:  400 QTC Calculation: 407 R Axis:   3 Text Interpretation:  Sinus rhythm Probable left atrial enlargement Baseline wander in lead(s) I III aVL no acute ST/T changes No old tracing to compare Confirmed by Sherwood Gambler [phone]) on 08/29/2018 10:34:57 AM   EKG Interpretation  Date/Time:  Tuesday August 29 2018 12:52:51 EDT Ventricular Rate:  57 PR Interval:    QRS Duration: 91 QT Interval:  418 QTC Calculation: 407 R Axis:   -8 Text Interpretation:  Sinus rhythm Low voltage, precordial leads Abnormal R-wave progression, early transition Left ventricular hypertrophy no significant change since earlier in the day Confirmed by Sherwood Gambler [phone]) on 08/29/2018 1:17:00 PM       Radiology Dg Chest 2 View  Result Date: 08/29/2018 CLINICAL DATA:  Chest pain and dizziness.  Hypertension. EXAM: CHEST - 2 VIEW COMPARISON:  None. FINDINGS: Lungs are  clear. Heart size and pulmonary vascularity are normal. No adenopathy. No pneumothorax. No bone lesions. IMPRESSION: No edema or consolidation.  No evident adenopathy. Electronically Signed   By: Lowella Grip III M.D.   On: 08/29/2018 10:33    Procedures Procedures (including critical care time)  Medications Ordered in ED Medications  sodium chloride flush (NS) 0.9 % injection 3 mL (3 mLs Intravenous Not Given 08/29/18 1018)     Initial Impression / Assessment and Plan / ED Course  I have reviewed the triage vital signs and the nursing notes.  Pertinent labs &  imaging results that were available during my care of the patient were reviewed by me and considered in my medical decision making (see chart for details).        Patient's ECGs are without ischemic changes.  Troponins are negative x2.  My suspicion for PE is pretty low.  Given all this, I think is reasonable to have her follow-up closely with her PCP, who she feels comfortable with.  Otherwise, we discussed return precautions.  Highly doubt dissection.  Final Clinical Impressions(s) / ED Diagnoses   Final diagnoses:  Nonspecific chest pain    ED Discharge Orders    None       Sherwood Gambler, MD 08/29/18 1335

## 2018-08-29 NOTE — ED Triage Notes (Signed)
Pt arrives via EMS from eye doctor appt where patient was being seen for checkup. Began having substernal CP radiating to left arm, pt diaphorectic in office. No SOB or nausea reported. 18g LAC, 324mg  aspirin. Pain rated 8/10, sharp, stabbing in nature. On EMS arrival patient was pain free. EKG NSR. Vitals 200/116, HR 60, 95%.

## 2018-08-29 NOTE — Discharge Instructions (Addendum)
If you develop recurrent, continued, or worsening chest pain, shortness of breath, fever, vomiting, abdominal or back pain, or any other new/concerning symptoms then return to the ER for evaluation.  

## 2018-08-31 ENCOUNTER — Other Ambulatory Visit: Payer: Self-pay | Admitting: Internal Medicine

## 2018-08-31 DIAGNOSIS — M5432 Sciatica, left side: Secondary | ICD-10-CM

## 2018-09-04 DIAGNOSIS — M5432 Sciatica, left side: Secondary | ICD-10-CM | POA: Diagnosis not present

## 2018-09-04 DIAGNOSIS — I10 Essential (primary) hypertension: Secondary | ICD-10-CM | POA: Diagnosis not present

## 2018-09-22 ENCOUNTER — Other Ambulatory Visit: Payer: Self-pay

## 2018-09-22 ENCOUNTER — Ambulatory Visit
Admission: RE | Admit: 2018-09-22 | Discharge: 2018-09-22 | Disposition: A | Discharge status: Home / Self Care | Payer: Medicare Other | Source: Ambulatory Visit | Attending: Internal Medicine | Admitting: Internal Medicine

## 2018-09-22 DIAGNOSIS — M4726 Other spondylosis with radiculopathy, lumbar region: Secondary | ICD-10-CM | POA: Diagnosis not present

## 2018-09-22 DIAGNOSIS — M5117 Intervertebral disc disorders with radiculopathy, lumbosacral region: Secondary | ICD-10-CM | POA: Diagnosis not present

## 2018-09-22 DIAGNOSIS — M48061 Spinal stenosis, lumbar region without neurogenic claudication: Secondary | ICD-10-CM | POA: Diagnosis not present

## 2018-09-22 DIAGNOSIS — M5432 Sciatica, left side: Secondary | ICD-10-CM

## 2018-10-02 DIAGNOSIS — M5416 Radiculopathy, lumbar region: Secondary | ICD-10-CM | POA: Diagnosis not present

## 2018-10-02 DIAGNOSIS — Z23 Encounter for immunization: Secondary | ICD-10-CM | POA: Diagnosis not present

## 2018-10-02 DIAGNOSIS — I10 Essential (primary) hypertension: Secondary | ICD-10-CM | POA: Diagnosis not present

## 2018-10-16 DIAGNOSIS — M5416 Radiculopathy, lumbar region: Secondary | ICD-10-CM | POA: Diagnosis not present

## 2018-11-09 DIAGNOSIS — M5416 Radiculopathy, lumbar region: Secondary | ICD-10-CM | POA: Diagnosis not present

## 2018-11-29 DIAGNOSIS — M48061 Spinal stenosis, lumbar region without neurogenic claudication: Secondary | ICD-10-CM | POA: Diagnosis not present

## 2018-12-25 DIAGNOSIS — M5416 Radiculopathy, lumbar region: Secondary | ICD-10-CM | POA: Diagnosis not present

## 2018-12-29 DIAGNOSIS — M5416 Radiculopathy, lumbar region: Secondary | ICD-10-CM | POA: Diagnosis not present

## 2019-01-31 ENCOUNTER — Other Ambulatory Visit: Payer: Self-pay | Admitting: Internal Medicine

## 2019-01-31 DIAGNOSIS — Z1231 Encounter for screening mammogram for malignant neoplasm of breast: Secondary | ICD-10-CM

## 2019-03-09 ENCOUNTER — Ambulatory Visit
Admission: RE | Admit: 2019-03-09 | Discharge: 2019-03-09 | Disposition: A | Discharge status: Home / Self Care | Payer: Medicare Other | Source: Ambulatory Visit | Attending: Internal Medicine | Admitting: Internal Medicine

## 2019-03-09 ENCOUNTER — Other Ambulatory Visit: Payer: Self-pay

## 2019-03-09 DIAGNOSIS — Z1231 Encounter for screening mammogram for malignant neoplasm of breast: Secondary | ICD-10-CM

## 2019-10-24 ENCOUNTER — Other Ambulatory Visit: Payer: Self-pay | Admitting: Orthopedic Surgery

## 2019-12-10 NOTE — Pre-Procedure Instructions (Signed)
Your procedure is scheduled on Thursday, December 2 at 07:30 AM.  Report to [facility] Main Entrance "A" at  05:30 A.M., and check in at the Admitting office.  Call this number if you have problems the morning of surgery:  [phone]  Call [phone] if you have any questions prior to your surgery date Monday-Friday 8am-4pm.    Remember:  Do not eat after midnight the night before your surgery.  You may drink clear liquids until 04:30 AM the morning of your surgery.   Clear liquids allowed are: Water, Non-Citrus Juices (without pulp), Carbonated Beverages, Clear Tea, Black Coffee Only, and Gatorade.  Enhanced Recovery after Surgery for Orthopedics Enhanced Recovery after Surgery is a protocol used to improve the stress on your body and your recovery after surgery.  Patient Instructions  . The night before surgery:  o No food after midnight. ONLY clear liquids after midnight  .  Marland Kitchen The day of surgery (if you do NOT have diabetes):  o Drink ONE (1) Pre-Surgery Clear Ensure by 04:30 AM the morning of surgery   o This drink was given to you during your hospital pre-op appointment visit. o Nothing else to drink after completing the Pre-Surgery Clear Ensure.          If you have questions, please contact your surgeon's office.      Take these medicines the morning of surgery with A SIP OF WATER:  IF NEEDED:  HYDROcodone-acetaminophen (NORCO/VICODIN)   As of today, STOP taking any Aspirin (unless otherwise instructed by your surgeon), Diclofenac Sodium (ASPERCREME ARTHRITIS PAIN EX), Aleve, Naproxen, Ibuprofen, Motrin, Advil, Goody's, BC's, all herbal medications, fish oil, and all vitamins.          The Morning of Surgery:            Do not wear jewelry, make up, or nail polish.            Do not wear lotions, powders, perfumes, or deodorant.            Do not shave 48 hours prior to surgery.              Do not bring valuables to the hospital.            [facility]  is not responsible for any belongings or valuables.  Do NOT Smoke (Tobacco/Vaping) or drink Alcohol 24 hours prior to your procedure.  If you use a CPAP at night, you may bring all equipment for your overnight stay.   Contacts, glasses, dentures or bridgework may not be worn into surgery.      For patients admitted to the hospital, discharge time will be determined by your treatment team.   Patients discharged the day of surgery will not be allowed to drive home, and someone needs to stay with them for 24 hours.    Special instructions:   [facility]- Preparing For Surgery  Before surgery, you can play an important role. Because skin is not sterile, your skin needs to be as free of germs as possible. You can reduce the number of germs on your skin by washing with CHG (chlorahexidine gluconate) Soap before surgery.  CHG is an antiseptic cleaner which kills germs and bonds with the skin to continue killing germs even after washing.    Oral Hygiene is also important to reduce your risk of infection.  Remember - BRUSH YOUR TEETH THE MORNING OF SURGERY WITH YOUR REGULAR TOOTHPASTE  Please do not  use if you have an allergy to CHG or antibacterial soaps. If your skin becomes reddened/irritated stop using the CHG.  Do not shave (including legs and underarms) for at least 48 hours prior to first CHG shower. It is OK to shave your face.  Please follow these instructions carefully.   1. Shower the NIGHT BEFORE SURGERY and the MORNING OF SURGERY with CHG Soap.   2. If you chose to wash your hair, wash your hair first as usual with your normal shampoo.  3. After you shampoo, rinse your hair and body thoroughly to remove the shampoo.  4. Use CHG as you would any other liquid soap. You can apply CHG directly to the skin and wash gently with a scrungie or a clean washcloth.   5. Apply the CHG Soap to your body ONLY FROM THE NECK DOWN.  Do not use on open wounds or open sores. Avoid contact with  your eyes, ears, mouth and genitals (private parts). Wash Face and genitals (private parts)  with your normal soap.   6. Wash thoroughly, paying special attention to the area where your surgery will be performed.  7. Thoroughly rinse your body with warm water from the neck down.  8. DO NOT shower/wash with your normal soap after using and rinsing off the CHG Soap.  9. Pat yourself dry with a CLEAN TOWEL.  10. Wear CLEAN PAJAMAS to bed the night before surgery  11. Place CLEAN SHEETS on your bed the night of your first shower and DO NOT SLEEP WITH PETS.   Day of Surgery: Wear Clean/Comfortable clothing the morning of surgery Do not apply any deodorants/lotions.   Remember to brush your teeth WITH YOUR REGULAR TOOTHPASTE.   Please read over the following fact sheets that you were given.

## 2019-12-11 ENCOUNTER — Encounter (HOSPITAL_COMMUNITY)
Admission: RE | Admit: 2019-12-11 | Discharge: 2019-12-11 | Disposition: A | Discharge status: Home / Self Care | Payer: Medicare Other | Source: Ambulatory Visit | Attending: Orthopedic Surgery | Admitting: Orthopedic Surgery

## 2019-12-11 ENCOUNTER — Other Ambulatory Visit: Payer: Self-pay

## 2019-12-11 ENCOUNTER — Encounter (HOSPITAL_COMMUNITY): Payer: Self-pay

## 2019-12-11 DIAGNOSIS — Z01812 Encounter for preprocedural laboratory examination: Secondary | ICD-10-CM | POA: Diagnosis present

## 2019-12-11 LAB — COMPREHENSIVE METABOLIC PANEL
ALT: 16 U/L (ref 0–44)
AST: 22 U/L (ref 15–41)
Albumin: 3.9 g/dL (ref 3.5–5.0)
Alkaline Phosphatase: 90 U/L (ref 38–126)
Anion gap: 11 (ref 5–15)
BUN: 11 mg/dL (ref 8–23)
CO2: 27 mmol/L (ref 22–32)
Calcium: 9.7 mg/dL (ref 8.9–10.3)
Chloride: 104 mmol/L (ref 98–111)
Creatinine, Ser: 1.13 mg/dL — ABNORMAL HIGH (ref 0.44–1.00)
GFR, Estimated: 51 mL/min — ABNORMAL LOW (ref >60)
Glucose, Bld: 111 mg/dL — ABNORMAL HIGH (ref 70–99)
Potassium: 3.8 mmol/L (ref 3.5–5.1)
Sodium: 142 mmol/L (ref 135–145)
Total Bilirubin: 0.4 mg/dL (ref 0.3–1.2)
Total Protein: 6.5 g/dL (ref 6.5–8.1)

## 2019-12-11 LAB — APTT: aPTT: 27 seconds (ref 24–36)

## 2019-12-11 LAB — CBC WITH DIFFERENTIAL/PLATELET
Abs Immature Granulocytes: 0.02 10*3/uL (ref 0.00–0.07)
Basophils Absolute: 0.1 10*3/uL (ref 0.0–0.1)
Basophils Relative: 1 %
Eosinophils Absolute: 0.2 10*3/uL (ref 0.0–0.5)
Eosinophils Relative: 3 %
HCT: 37.4 % (ref 36.0–46.0)
Hemoglobin: 12.5 g/dL (ref 12.0–15.0)
Immature Granulocytes: 0 %
Lymphocytes Relative: 22 %
Lymphs Abs: 1.5 10*3/uL (ref 0.7–4.0)
MCH: 30.2 pg (ref 26.0–34.0)
MCHC: 33.4 g/dL (ref 30.0–36.0)
MCV: 90.3 fL (ref 80.0–100.0)
Monocytes Absolute: 0.6 10*3/uL (ref 0.1–1.0)
Monocytes Relative: 9 %
Neutro Abs: 4.4 10*3/uL (ref 1.7–7.7)
Neutrophils Relative %: 65 %
Platelets: 328 10*3/uL (ref 150–400)
RBC: 4.14 MIL/uL (ref 3.87–5.11)
RDW: 13.7 % (ref 11.5–15.5)
WBC: 6.7 10*3/uL (ref 4.0–10.5)
nRBC: 0 % (ref 0.0–0.2)

## 2019-12-11 LAB — PROTIME-INR
INR: 1.1 (ref 0.8–1.2)
Prothrombin Time: 14 seconds (ref 11.4–15.2)

## 2019-12-11 LAB — URINALYSIS, ROUTINE W REFLEX MICROSCOPIC
Bilirubin Urine: NEGATIVE
Glucose, UA: NEGATIVE mg/dL
Hgb urine dipstick: NEGATIVE
Ketones, ur: NEGATIVE mg/dL
Leukocytes,Ua: NEGATIVE
Nitrite: NEGATIVE
Protein, ur: NEGATIVE mg/dL
Specific Gravity, Urine: 1.012 (ref 1.005–1.030)
pH: 6 (ref 5.0–8.0)

## 2019-12-11 LAB — TYPE AND SCREEN
ABO/RH(D): O POS
Antibody Screen: NEGATIVE

## 2019-12-11 LAB — SURGICAL PCR SCREEN
MRSA, PCR: NEGATIVE
Staphylococcus aureus: NEGATIVE

## 2019-12-11 NOTE — Progress Notes (Signed)
PCP - Lavone Orn Cardiologist - denies  Chest x-ray - n/a EKG - n/a  ERAS Protcol - yes, Ensure ordered & given PRE-SURGERY Ensure or G2-   COVID TEST- 12-08-19   Anesthesia review: n/a  Patient denies shortness of breath, fever, cough and chest pain at PAT appointment   All instructions explained to the patient, with a verbal understanding of the material. Patient agrees to go over the instructions while at home for a better understanding. Patient also instructed to self quarantine after being tested for COVID-19. The opportunity to ask questions was provided.

## 2019-12-18 ENCOUNTER — Other Ambulatory Visit (HOSPITAL_COMMUNITY)
Admission: RE | Admit: 2019-12-18 | Discharge: 2019-12-18 | Disposition: A | Discharge status: Home / Self Care | Payer: Medicare Other | Source: Ambulatory Visit | Attending: Orthopedic Surgery | Admitting: Orthopedic Surgery

## 2019-12-18 DIAGNOSIS — Z01812 Encounter for preprocedural laboratory examination: Secondary | ICD-10-CM | POA: Insufficient documentation

## 2019-12-18 DIAGNOSIS — Z20822 Contact with and (suspected) exposure to covid-19: Secondary | ICD-10-CM | POA: Diagnosis not present

## 2019-12-18 LAB — SARS CORONAVIRUS 2 (TAT 6-24 HRS): SARS Coronavirus 2: NEGATIVE

## 2019-12-20 ENCOUNTER — Other Ambulatory Visit: Payer: Self-pay

## 2019-12-20 ENCOUNTER — Encounter (HOSPITAL_COMMUNITY): Payer: Self-pay | Admitting: Orthopedic Surgery

## 2019-12-20 ENCOUNTER — Ambulatory Visit (HOSPITAL_COMMUNITY)
Admission: RE | Disposition: A | Discharge status: Home / Self Care | Payer: Self-pay | Source: Home / Self Care | Attending: Orthopedic Surgery

## 2019-12-20 ENCOUNTER — Inpatient Hospital Stay (HOSPITAL_COMMUNITY): Payer: Medicare Other

## 2019-12-20 ENCOUNTER — Observation Stay (HOSPITAL_COMMUNITY)
Admission: RE | Admit: 2019-12-20 | Discharge: 2019-12-21 | Disposition: A | Discharge status: Home / Self Care | Payer: Medicare Other | Attending: Orthopedic Surgery | Admitting: Orthopedic Surgery

## 2019-12-20 ENCOUNTER — Inpatient Hospital Stay (HOSPITAL_COMMUNITY): Payer: Medicare Other | Admitting: Anesthesiology

## 2019-12-20 ENCOUNTER — Inpatient Hospital Stay (HOSPITAL_COMMUNITY): Payer: Medicare Other | Admitting: Vascular Surgery

## 2019-12-20 DIAGNOSIS — M48061 Spinal stenosis, lumbar region without neurogenic claudication: Secondary | ICD-10-CM | POA: Diagnosis not present

## 2019-12-20 DIAGNOSIS — M545 Low back pain, unspecified: Secondary | ICD-10-CM | POA: Diagnosis present

## 2019-12-20 DIAGNOSIS — Z419 Encounter for procedure for purposes other than remedying health state, unspecified: Secondary | ICD-10-CM

## 2019-12-20 DIAGNOSIS — M4316 Spondylolisthesis, lumbar region: Secondary | ICD-10-CM | POA: Diagnosis not present

## 2019-12-20 DIAGNOSIS — M4800 Spinal stenosis, site unspecified: Secondary | ICD-10-CM | POA: Diagnosis present

## 2019-12-20 DIAGNOSIS — Z87891 Personal history of nicotine dependence: Secondary | ICD-10-CM | POA: Insufficient documentation

## 2019-12-20 DIAGNOSIS — M5416 Radiculopathy, lumbar region: Secondary | ICD-10-CM | POA: Diagnosis not present

## 2019-12-20 HISTORY — PX: TRANSFORAMINAL LUMBAR INTERBODY FUSION (TLIF) WITH PEDICLE SCREW FIXATION 2 LEVEL: SHX6142

## 2019-12-20 LAB — ABO/RH: ABO/RH(D): O POS

## 2019-12-20 SURGERY — TRANSFORAMINAL LUMBAR INTERBODY FUSION (TLIF) WITH PEDICLE SCREW FIXATION 2 LEVEL
Anesthesia: General | Laterality: Left

## 2019-12-20 MED ORDER — MORPHINE SULFATE (PF) 2 MG/ML IV SOLN
1.0000 mg | INTRAVENOUS | Status: DC | PRN
  Administered 2019-12-20: 2 mg via INTRAVENOUS
  Filled 2019-12-20: qty 1

## 2019-12-20 MED ORDER — ALBUMIN HUMAN 5 % IV SOLN
INTRAVENOUS | Status: AC
  Administered 2019-12-20: 12.5 g
  Filled 2019-12-20: qty 250

## 2019-12-20 MED ORDER — METHYLENE BLUE 0.5 % INJ SOLN
INTRAVENOUS | Status: AC
  Filled 2019-12-20: qty 10

## 2019-12-20 MED ORDER — BISACODYL 5 MG PO TBEC: 5.0000 mg | DELAYED_RELEASE_TABLET | Freq: Every day | ORAL | Status: DC | PRN

## 2019-12-20 MED ORDER — FENTANYL CITRATE (PF) 250 MCG/5ML IJ SOLN
INTRAMUSCULAR | Status: AC
  Filled 2019-12-20: qty 5

## 2019-12-20 MED ORDER — SENNOSIDES-DOCUSATE SODIUM 8.6-50 MG PO TABS: 1.0000 | ORAL_TABLET | Freq: Every evening | ORAL | Status: DC | PRN

## 2019-12-20 MED ORDER — BUPIVACAINE-EPINEPHRINE (PF) 0.25% -1:200000 IJ SOLN
INTRAMUSCULAR | Status: AC
  Filled 2019-12-20: qty 10

## 2019-12-20 MED ORDER — SODIUM CHLORIDE (PF) 0.9 % IJ SOLN: INTRAMUSCULAR | Status: DC | PRN

## 2019-12-20 MED ORDER — ONDANSETRON HCL 4 MG/2ML IJ SOLN
INTRAMUSCULAR | Status: DC | PRN
  Administered 2019-12-20: 4 mg via INTRAVENOUS

## 2019-12-20 MED ORDER — POVIDONE-IODINE 7.5 % EX SOLN
Freq: Once | CUTANEOUS | Status: DC
  Filled 2019-12-20: qty 118

## 2019-12-20 MED ORDER — HEMOSTATIC AGENTS (NO CHARGE) OPTIME
TOPICAL | Status: DC | PRN
  Administered 2019-12-20: 1 via TOPICAL

## 2019-12-20 MED ORDER — SODIUM CHLORIDE (PF) 0.9 % IJ SOLN
INTRAMUSCULAR | Status: AC
  Filled 2019-12-20: qty 10

## 2019-12-20 MED ORDER — IRBESARTAN 150 MG PO TABS
75.0000 mg | ORAL_TABLET | Freq: Every day | ORAL | Status: DC
  Administered 2019-12-21: 75 mg via ORAL
  Filled 2019-12-20: qty 1

## 2019-12-20 MED ORDER — ADULT MULTIVITAMIN W/MINERALS CH
1.0000 | ORAL_TABLET | Freq: Every day | ORAL | Status: DC
  Administered 2019-12-21: 1 via ORAL
  Filled 2019-12-20: qty 1

## 2019-12-20 MED ORDER — ONDANSETRON HCL 4 MG/2ML IJ SOLN: 4.0000 mg | Freq: Four times a day (QID) | INTRAMUSCULAR | Status: DC | PRN

## 2019-12-20 MED ORDER — ROCURONIUM BROMIDE 10 MG/ML (PF) SYRINGE
PREFILLED_SYRINGE | INTRAVENOUS | Status: DC | PRN
  Administered 2019-12-20 (×2): 50 mg via INTRAVENOUS

## 2019-12-20 MED ORDER — FENTANYL CITRATE (PF) 250 MCG/5ML IJ SOLN
INTRAMUSCULAR | Status: DC | PRN
Start: 2019-12-20 — End: 2019-12-20
  Administered 2019-12-20 (×5): 50 ug via INTRAVENOUS

## 2019-12-20 MED ORDER — METHOCARBAMOL 500 MG PO TABS
500.0000 mg | ORAL_TABLET | Freq: Four times a day (QID) | ORAL | Status: DC | PRN
  Administered 2019-12-20 – 2019-12-21 (×3): 500 mg via ORAL
  Filled 2019-12-20 (×3): qty 1

## 2019-12-20 MED ORDER — OXYCODONE HCL 5 MG PO TABS: 5.0000 mg | ORAL_TABLET | Freq: Once | ORAL | Status: DC | PRN

## 2019-12-20 MED ORDER — ALBUMIN HUMAN 5 % IV SOLN
12.5000 g | Freq: Once | INTRAVENOUS | Status: AC
  Administered 2019-12-20: 12.5 g via INTRAVENOUS

## 2019-12-20 MED ORDER — CEFAZOLIN SODIUM 1 G IJ SOLR
INTRAMUSCULAR | Status: AC
  Filled 2019-12-20: qty 20

## 2019-12-20 MED ORDER — SODIUM CHLORIDE 0.9% FLUSH
3.0000 mL | Freq: Two times a day (BID) | INTRAVENOUS | Status: DC
  Administered 2019-12-21: 3 mL via INTRAVENOUS

## 2019-12-20 MED ORDER — PROPOFOL 500 MG/50ML IV EMUL
INTRAVENOUS | Status: DC | PRN
  Administered 2019-12-20 (×2): 50 ug/kg/min via INTRAVENOUS

## 2019-12-20 MED ORDER — PHENYLEPHRINE 40 MCG/ML (10ML) SYRINGE FOR IV PUSH (FOR BLOOD PRESSURE SUPPORT)
PREFILLED_SYRINGE | INTRAVENOUS | Status: AC
  Filled 2019-12-20: qty 10

## 2019-12-20 MED ORDER — BUPIVACAINE HCL (PF) 0.25 % IJ SOLN
INTRAMUSCULAR | Status: DC | PRN
  Administered 2019-12-20: 20 mL

## 2019-12-20 MED ORDER — BUPIVACAINE LIPOSOME 1.3 % IJ SUSP
20.0000 mL | INTRAMUSCULAR | Status: AC
  Administered 2019-12-20: 20 mL
  Filled 2019-12-20: qty 20

## 2019-12-20 MED ORDER — 0.9 % SODIUM CHLORIDE (POUR BTL) OPTIME
TOPICAL | Status: DC | PRN
  Administered 2019-12-20: 5000 mL

## 2019-12-20 MED ORDER — ALUM & MAG HYDROXIDE-SIMETH 200-200-20 MG/5ML PO SUSP: 30.0000 mL | Freq: Four times a day (QID) | ORAL | Status: DC | PRN

## 2019-12-20 MED ORDER — ONDANSETRON HCL 4 MG PO TABS: 4.0000 mg | ORAL_TABLET | Freq: Four times a day (QID) | ORAL | Status: DC | PRN

## 2019-12-20 MED ORDER — ROCURONIUM BROMIDE 10 MG/ML (PF) SYRINGE
PREFILLED_SYRINGE | INTRAVENOUS | Status: AC
  Filled 2019-12-20: qty 10

## 2019-12-20 MED ORDER — ACETAMINOPHEN 325 MG PO TABS: 650.0000 mg | ORAL_TABLET | ORAL | Status: DC | PRN

## 2019-12-20 MED ORDER — ALBUMIN HUMAN 5 % IV SOLN
INTRAVENOUS | Status: DC | PRN
  Administered 2019-12-20: 12.5 g via INTRAVENOUS

## 2019-12-20 MED ORDER — ACETAMINOPHEN 650 MG RE SUPP: 650.0000 mg | RECTAL | Status: DC | PRN

## 2019-12-20 MED ORDER — ONDANSETRON HCL 4 MG/2ML IJ SOLN: 4.0000 mg | Freq: Once | INTRAMUSCULAR | Status: DC | PRN

## 2019-12-20 MED ORDER — CEFAZOLIN SODIUM-DEXTROSE 2-4 GM/100ML-% IV SOLN
2.0000 g | Freq: Three times a day (TID) | INTRAVENOUS | Status: AC
  Administered 2019-12-20 – 2019-12-21 (×2): 2 g via INTRAVENOUS
  Filled 2019-12-20 (×2): qty 100

## 2019-12-20 MED ORDER — METHYLENE BLUE 0.5 % INJ SOLN
INTRAVENOUS | Status: DC | PRN
  Administered 2019-12-20: 10 mL

## 2019-12-20 MED ORDER — SUGAMMADEX SODIUM 200 MG/2ML IV SOLN
INTRAVENOUS | Status: DC | PRN
  Administered 2019-12-20: 200 mg via INTRAVENOUS

## 2019-12-20 MED ORDER — DOCUSATE SODIUM 100 MG PO CAPS
100.0000 mg | ORAL_CAPSULE | Freq: Two times a day (BID) | ORAL | Status: DC
  Administered 2019-12-20 – 2019-12-21 (×2): 100 mg via ORAL
  Filled 2019-12-20 (×2): qty 1

## 2019-12-20 MED ORDER — BUPIVACAINE HCL (PF) 0.25 % IJ SOLN
INTRAMUSCULAR | Status: AC
  Filled 2019-12-20: qty 30

## 2019-12-20 MED ORDER — PROPOFOL 10 MG/ML IV BOLUS
INTRAVENOUS | Status: AC
  Filled 2019-12-20: qty 40

## 2019-12-20 MED ORDER — FENTANYL CITRATE (PF) 100 MCG/2ML IJ SOLN: 25.0000 ug | INTRAMUSCULAR | Status: DC | PRN

## 2019-12-20 MED ORDER — ZOLPIDEM TARTRATE 5 MG PO TABS: 5.0000 mg | ORAL_TABLET | Freq: Every evening | ORAL | Status: DC | PRN

## 2019-12-20 MED ORDER — LIDOCAINE HCL (PF) 2 % IJ SOLN
INTRAMUSCULAR | Status: AC
  Filled 2019-12-20: qty 5

## 2019-12-20 MED ORDER — HYDROCHLOROTHIAZIDE 12.5 MG PO CAPS
12.5000 mg | ORAL_CAPSULE | Freq: Every day | ORAL | Status: DC
  Administered 2019-12-21: 12.5 mg via ORAL
  Filled 2019-12-20: qty 1

## 2019-12-20 MED ORDER — POTASSIUM CHLORIDE IN NACL 20-0.9 MEQ/L-% IV SOLN: INTRAVENOUS | Status: DC

## 2019-12-20 MED ORDER — THROMBIN (RECOMBINANT) 20000 UNITS EX SOLR
CUTANEOUS | Status: AC
  Filled 2019-12-20: qty 20000

## 2019-12-20 MED ORDER — PHENYLEPHRINE HCL (PRESSORS) 10 MG/ML IV SOLN
INTRAVENOUS | Status: AC
  Filled 2019-12-20: qty 1

## 2019-12-20 MED ORDER — PROPOFOL 10 MG/ML IV BOLUS
INTRAVENOUS | Status: DC | PRN
  Administered 2019-12-20: 160 mg via INTRAVENOUS

## 2019-12-20 MED ORDER — PHENYLEPHRINE 40 MCG/ML (10ML) SYRINGE FOR IV PUSH (FOR BLOOD PRESSURE SUPPORT)
PREFILLED_SYRINGE | INTRAVENOUS | Status: DC | PRN
  Administered 2019-12-20: 40 ug via INTRAVENOUS
  Administered 2019-12-20: 120 ug via INTRAVENOUS
  Administered 2019-12-20 (×4): 80 ug via INTRAVENOUS

## 2019-12-20 MED ORDER — MENTHOL 3 MG MT LOZG: 1.0000 | LOZENGE | OROMUCOSAL | Status: DC | PRN

## 2019-12-20 MED ORDER — OXYCODONE HCL 5 MG/5ML PO SOLN: 5.0000 mg | Freq: Once | ORAL | Status: DC | PRN

## 2019-12-20 MED ORDER — METHOCARBAMOL 1000 MG/10ML IJ SOLN
500.0000 mg | Freq: Four times a day (QID) | INTRAVENOUS | Status: DC | PRN
  Filled 2019-12-20: qty 5

## 2019-12-20 MED ORDER — PHENOL 1.4 % MT LIQD: 1.0000 | OROMUCOSAL | Status: DC | PRN

## 2019-12-20 MED ORDER — ORAL CARE MOUTH RINSE: 15.0000 mL | Freq: Once | OROMUCOSAL | Status: AC

## 2019-12-20 MED ORDER — LIDOCAINE HCL (CARDIAC) PF 100 MG/5ML IV SOSY
PREFILLED_SYRINGE | INTRAVENOUS | Status: DC | PRN
  Administered 2019-12-20: 40 mg via INTRAVENOUS

## 2019-12-20 MED ORDER — FLEET ENEMA 7-19 GM/118ML RE ENEM: 1.0000 | ENEMA | Freq: Once | RECTAL | Status: DC | PRN

## 2019-12-20 MED ORDER — BUPIVACAINE-EPINEPHRINE 0.25% -1:200000 IJ SOLN
INTRAMUSCULAR | Status: DC | PRN
  Administered 2019-12-20: 10 mL

## 2019-12-20 MED ORDER — ALBUMIN HUMAN 5 % IV SOLN
INTRAVENOUS | Status: AC
  Filled 2019-12-20: qty 250

## 2019-12-20 MED ORDER — MINERAL OIL LIGHT 100 % EX OIL
TOPICAL_OIL | CUTANEOUS | Status: DC | PRN
  Administered 2019-12-20: 1 via TOPICAL

## 2019-12-20 MED ORDER — VALSARTAN-HYDROCHLOROTHIAZIDE 80-12.5 MG PO TABS: 1.0000 | ORAL_TABLET | Freq: Every day | ORAL | Status: DC

## 2019-12-20 MED ORDER — CHLORHEXIDINE GLUCONATE 0.12 % MT SOLN
15.0000 mL | Freq: Once | OROMUCOSAL | Status: AC
  Administered 2019-12-20: 15 mL via OROMUCOSAL
  Filled 2019-12-20: qty 15

## 2019-12-20 MED ORDER — CEFAZOLIN SODIUM-DEXTROSE 2-4 GM/100ML-% IV SOLN
2.0000 g | INTRAVENOUS | Status: AC
  Administered 2019-12-20 (×2): 2 g via INTRAVENOUS
  Filled 2019-12-20: qty 100

## 2019-12-20 MED ORDER — OXYCODONE-ACETAMINOPHEN 5-325 MG PO TABS
1.0000 | ORAL_TABLET | ORAL | Status: DC | PRN
  Administered 2019-12-20 – 2019-12-21 (×5): 2 via ORAL
  Filled 2019-12-20 (×5): qty 2

## 2019-12-20 MED ORDER — SODIUM CHLORIDE 0.9 % IV SOLN: 250.0000 mL | INTRAVENOUS | Status: DC

## 2019-12-20 MED ORDER — MAGNESIUM OXIDE 400 (241.3 MG) MG PO TABS
400.0000 mg | ORAL_TABLET | Freq: Every day | ORAL | Status: DC
  Administered 2019-12-21: 400 mg via ORAL
  Filled 2019-12-20: qty 1

## 2019-12-20 MED ORDER — FENTANYL CITRATE (PF) 100 MCG/2ML IJ SOLN: INTRAMUSCULAR | Status: DC | PRN

## 2019-12-20 MED ORDER — LACTATED RINGERS IV SOLN: INTRAVENOUS | Status: DC

## 2019-12-20 MED ORDER — ONDANSETRON HCL 4 MG/2ML IJ SOLN
INTRAMUSCULAR | Status: AC
  Filled 2019-12-20: qty 2

## 2019-12-20 MED ORDER — DEXMEDETOMIDINE (PRECEDEX) IN NS 20 MCG/5ML (4 MCG/ML) IV SYRINGE
PREFILLED_SYRINGE | INTRAVENOUS | Status: DC | PRN
  Administered 2019-12-20 (×2): 4 ug via INTRAVENOUS
  Administered 2019-12-20: 8 ug via INTRAVENOUS
  Administered 2019-12-20: 4 ug via INTRAVENOUS

## 2019-12-20 MED ORDER — PROPOFOL 1000 MG/100ML IV EMUL
INTRAVENOUS | Status: AC
  Filled 2019-12-20: qty 200

## 2019-12-20 MED ORDER — PHENYLEPHRINE HCL-NACL 10-0.9 MG/250ML-% IV SOLN
INTRAVENOUS | Status: DC | PRN
  Administered 2019-12-20: 70 ug/min via INTRAVENOUS
  Administered 2019-12-20: 25 ug/min via INTRAVENOUS

## 2019-12-20 MED ORDER — THROMBIN 20000 UNITS EX SOLR
CUTANEOUS | Status: DC | PRN
  Administered 2019-12-20: 20000 [IU] via TOPICAL

## 2019-12-20 MED ORDER — SODIUM CHLORIDE 0.9% FLUSH: 3.0000 mL | INTRAVENOUS | Status: DC | PRN

## 2019-12-20 SURGICAL SUPPLY — 102 items
AGENT HMST KT MTR STRL THRMB (HEMOSTASIS)
APL SKNCLS STERI-STRIP NONHPOA (GAUZE/BANDAGES/DRESSINGS) ×1
BENZOIN TINCTURE PRP APPL 2/3 (GAUZE/BANDAGES/DRESSINGS) ×3 IMPLANT
BLADE CLIPPER SURG (BLADE) IMPLANT
BONE VIVIGEN FORMABLE 10CC (Bone Implant) ×6 IMPLANT
BUR PRESCISION 1.7 ELITE (BURR) ×3 IMPLANT
BUR ROUND FLUTED 5 RND (BURR) ×2 IMPLANT
BUR ROUND FLUTED 5MM RND (BURR) ×1
BUR ROUND PRECISION 4.0 (BURR) IMPLANT
BUR ROUND PRECISION 4.0MM (BURR)
BUR SABER RD CUTTING 3.0 (BURR) IMPLANT
BUR SABER RD CUTTING 3.0MM (BURR)
CAGE CONCORDE BULLET 9X10X23 (Cage) ×3 IMPLANT
CAGE CONCORDE BULLET 9X7X23 (Cage) ×1 IMPLANT
CAGE CONCORDE BULLET 9X7X23MM (Cage) ×1 IMPLANT
CAGE CONCORDE BULLET 9X9X23 (Cage) ×2 IMPLANT
CAGE CONCORDE BULLET 9X9X23MM (Cage) ×1 IMPLANT
CAGE SPNL 5D BLT NOSE 23X9X10 (Cage) IMPLANT
CAGE SPNL PRLL BLT NOSE 23X9X9 (Cage) IMPLANT
CARTRIDGE OIL MAESTRO DRILL (MISCELLANEOUS) ×1 IMPLANT
CLOSURE WOUND 1/2 X4 (GAUZE/BANDAGES/DRESSINGS) ×1
CNTNR URN SCR LID CUP LEK RST (MISCELLANEOUS) ×1 IMPLANT
CONT SPEC 4OZ STRL OR WHT (MISCELLANEOUS) ×3
COVER MAYO STAND STRL (DRAPES) ×6 IMPLANT
COVER SURGICAL LIGHT HANDLE (MISCELLANEOUS) ×3 IMPLANT
COVER WAND RF STERILE (DRAPES) ×3 IMPLANT
DIFFUSER DRILL AIR PNEUMATIC (MISCELLANEOUS) ×3 IMPLANT
DRAIN CHANNEL 15F RND FF W/TCR (WOUND CARE) IMPLANT
DRAPE C-ARM 42X72 X-RAY (DRAPES) ×3 IMPLANT
DRAPE C-ARMOR (DRAPES) IMPLANT
DRAPE POUCH INSTRU U-SHP 10X18 (DRAPES) ×3 IMPLANT
DRAPE SURG 17X23 STRL (DRAPES) ×12 IMPLANT
DURAPREP 26ML APPLICATOR (WOUND CARE) ×3 IMPLANT
ELECT BLADE 4.0 EZ CLEAN MEGAD (MISCELLANEOUS) ×3
ELECT CAUTERY BLADE 6.4 (BLADE) ×3 IMPLANT
ELECT REM PT RETURN 9FT ADLT (ELECTROSURGICAL) ×3
ELECTRODE BLDE 4.0 EZ CLN MEGD (MISCELLANEOUS) ×1 IMPLANT
ELECTRODE REM PT RTRN 9FT ADLT (ELECTROSURGICAL) ×1 IMPLANT
EVACUATOR SILICONE 100CC (DRAIN) IMPLANT
FEE INTRAOP MONITOR IMPULS NCS (MISCELLANEOUS) IMPLANT
FILTER STRAW FLUID ASPIR (MISCELLANEOUS) ×3 IMPLANT
GAUZE 4X4 16PLY RFD (DISPOSABLE) ×3 IMPLANT
GAUZE SPONGE 4X4 12PLY STRL (GAUZE/BANDAGES/DRESSINGS) ×3 IMPLANT
GLOVE BIO SURGEON STRL SZ7 (GLOVE) ×3 IMPLANT
GLOVE BIO SURGEON STRL SZ8 (GLOVE) ×3 IMPLANT
GLOVE BIOGEL PI IND STRL 7.0 (GLOVE) ×1 IMPLANT
GLOVE BIOGEL PI IND STRL 8 (GLOVE) ×1 IMPLANT
GLOVE BIOGEL PI INDICATOR 7.0 (GLOVE) ×2
GLOVE BIOGEL PI INDICATOR 8 (GLOVE) ×2
GOWN STRL REUS W/ TWL LRG LVL3 (GOWN DISPOSABLE) ×2 IMPLANT
GOWN STRL REUS W/ TWL XL LVL3 (GOWN DISPOSABLE) ×1 IMPLANT
GOWN STRL REUS W/TWL LRG LVL3 (GOWN DISPOSABLE) ×6
GOWN STRL REUS W/TWL XL LVL3 (GOWN DISPOSABLE) ×3
GRAFT BNE MATRIX VG FRMBL L 10 (Bone Implant) IMPLANT
INTRAOP MONITOR FEE IMPULS NCS (MISCELLANEOUS) ×1
INTRAOP MONITOR FEE IMPULSE (MISCELLANEOUS) ×3
IV CATH 14GX2 1/4 (CATHETERS) ×3 IMPLANT
KIT BASIN OR (CUSTOM PROCEDURE TRAY) ×3 IMPLANT
KIT POSITION SURG JACKSON T1 (MISCELLANEOUS) ×3 IMPLANT
KIT TURNOVER KIT B (KITS) ×3 IMPLANT
MARKER SKIN DUAL TIP RULER LAB (MISCELLANEOUS) ×6 IMPLANT
NDL 18GX1X1/2 (RX/OR ONLY) (NEEDLE) ×1 IMPLANT
NDL HYPO 25GX1X1/2 BEV (NEEDLE) ×1 IMPLANT
NDL SPNL 18GX3.5 QUINCKE PK (NEEDLE) ×2 IMPLANT
NEEDLE 18GX1X1/2 (RX/OR ONLY) (NEEDLE) ×3 IMPLANT
NEEDLE 22X1 1/2 (OR ONLY) (NEEDLE) ×6 IMPLANT
NEEDLE HYPO 25GX1X1/2 BEV (NEEDLE) ×3 IMPLANT
NEEDLE SPNL 18GX3.5 QUINCKE PK (NEEDLE) ×6 IMPLANT
NS IRRIG 1000ML POUR BTL (IV SOLUTION) ×3 IMPLANT
OIL CARTRIDGE MAESTRO DRILL (MISCELLANEOUS) ×3
PACK LAMINECTOMY ORTHO (CUSTOM PROCEDURE TRAY) ×3 IMPLANT
PACK UNIVERSAL I (CUSTOM PROCEDURE TRAY) ×3 IMPLANT
PAD ARMBOARD 7.5X6 YLW CONV (MISCELLANEOUS) ×6 IMPLANT
PATTIES SURGICAL .5 X.5 (GAUZE/BANDAGES/DRESSINGS) ×2 IMPLANT
PATTIES SURGICAL .5 X1 (DISPOSABLE) ×3 IMPLANT
PATTIES SURGICAL .5X1.5 (GAUZE/BANDAGES/DRESSINGS) ×3 IMPLANT
PROBE PED SCREW MONOP 3 BT NCS (MISCELLANEOUS) IMPLANT
ROD EXPEDIUM PREBENT 85MM (Rod) ×4 IMPLANT
SCREW PROBE PEDICLE MONOPOLAR (MISCELLANEOUS) ×3
SCREW SET SINGLE INNER (Screw) ×16 IMPLANT
SCREW VIPER CORT FIX 6.00X30 (Screw) ×16 IMPLANT
SPONGE INTESTINAL PEANUT (DISPOSABLE) ×3 IMPLANT
SPONGE LAP 4X18 RFD (DISPOSABLE) ×2 IMPLANT
SPONGE SURGIFOAM ABS GEL 100 (HEMOSTASIS) ×3 IMPLANT
STRIP CLOSURE SKIN 1/2X4 (GAUZE/BANDAGES/DRESSINGS) ×3 IMPLANT
SURGIFLO W/THROMBIN 8M KIT (HEMOSTASIS) IMPLANT
SUT MNCRL AB 4-0 PS2 18 (SUTURE) ×3 IMPLANT
SUT VIC AB 0 CT1 18XCR BRD 8 (SUTURE) ×1 IMPLANT
SUT VIC AB 0 CT1 8-18 (SUTURE) ×3
SUT VIC AB 1 CT1 18XCR BRD 8 (SUTURE) ×1 IMPLANT
SUT VIC AB 1 CT1 8-18 (SUTURE) ×3
SUT VIC AB 2-0 CT2 18 VCP726D (SUTURE) ×3 IMPLANT
SYR 20ML LL LF (SYRINGE) ×6 IMPLANT
SYR BULB IRRIG 60ML STRL (SYRINGE) ×3 IMPLANT
SYR CONTROL 10ML LL (SYRINGE) ×6 IMPLANT
SYR TB 1ML LUER SLIP (SYRINGE) ×3 IMPLANT
TAP EXPEDIUM DL 4.35 (INSTRUMENTS) ×2 IMPLANT
TAP EXPEDIUM DL 5.0 (INSTRUMENTS) ×2 IMPLANT
TAP EXPEDIUM DL 6.0 (INSTRUMENTS) ×2 IMPLANT
TRAY FOLEY MTR SLVR 16FR STAT (SET/KITS/TRAYS/PACK) ×3 IMPLANT
WATER STERILE IRR 1000ML POUR (IV SOLUTION) ×3 IMPLANT
YANKAUER SUCT BULB TIP NO VENT (SUCTIONS) ×3 IMPLANT

## 2019-12-20 NOTE — Anesthesia Preprocedure Evaluation (Signed)
Anesthesia Evaluation  Patient identified by MRN, date of birth, ID band Patient awake    Reviewed: Allergy & Precautions, NPO status , Patient's Chart, lab work & pertinent test results  Airway Mallampati: II  TM Distance: >3 FB     Dental  (+) Edentulous Upper, Dental Advisory Given   Pulmonary former smoker,    breath sounds clear to auscultation       Cardiovascular  Rhythm:Regular Rate:Normal     Neuro/Psych    GI/Hepatic   Endo/Other    Renal/GU      Musculoskeletal   Abdominal   Peds  Hematology   Anesthesia Other Findings   Reproductive/Obstetrics                             Anesthesia Physical Anesthesia Plan  ASA: II  Anesthesia Plan: General   Post-op Pain Management:    Induction: Intravenous  PONV Risk Score and Plan: Ondansetron and Dexamethasone  Airway Management Planned: Oral ETT  Additional Equipment:   Intra-op Plan:   Post-operative Plan: Extubation in OR  Informed Consent: I have reviewed the patients History and Physical, chart, labs and discussed the procedure including the risks, benefits and alternatives for the proposed anesthesia with the patient or authorized representative who has indicated his/her understanding and acceptance.     Dental advisory given  Plan Discussed with: Anesthesiologist and CRNA  Anesthesia Plan Comments:         Anesthesia Quick Evaluation

## 2019-12-20 NOTE — Transfer of Care (Signed)
Immediate Anesthesia Transfer of Care Note  Patient: Jean Brennan  Procedure(s) Performed: LEFT-SIDED LUMBAR THREE THROUGH FOUR, LUMBAR FOUR THROUGH FIVE, LUMBAR FIVE THROUGH SACRAL TRANSFORAMINAL LUMBAR INTERBODY FUSION WITH INSTRUMENTATION AND ALOGRAFT (Left )  Patient Location: PACU  Anesthesia Type:General  Level of Consciousness: drowsy  Airway & Oxygen Therapy: Patient Spontanous Breathing and Patient connected to face mask oxygen  Post-op Assessment: Report given to RN and Post -op Vital signs reviewed and stable  Post vital signs: Reviewed and stable  Last Vitals:  Vitals Value Taken Time  BP 81/48 12/20/19 1412  Temp 36.8 C 12/20/19 1412  Pulse 79 12/20/19 1421  Resp 18 12/20/19 1421  SpO2 100 % 12/20/19 1421  Vitals shown include unvalidated device data.  Last Pain:  Vitals:   12/20/19 1412  TempSrc:   PainSc: 8          Complications: No complications documented.

## 2019-12-20 NOTE — Anesthesia Procedure Notes (Signed)
Procedure Name: Intubation Date/Time: 12/20/2019 7:43 AM Performed by: Valda Favia, CRNA Pre-anesthesia Checklist: Patient identified, Emergency Drugs available, Suction available and Patient being monitored Patient Re-evaluated:Patient Re-evaluated prior to induction Oxygen Delivery Method: Circle System Utilized Preoxygenation: Pre-oxygenation with 100% oxygen Induction Type: IV induction Ventilation: Mask ventilation without difficulty and Oral airway inserted - appropriate to patient size Laryngoscope Size: Mac and 4 Grade View: Grade I Tube type: Oral Tube size: 7.0 mm Number of attempts: 1 Airway Equipment and Method: Stylet and Oral airway Placement Confirmation: ETT inserted through vocal cords under direct vision,  positive ETCO2 and breath sounds checked- equal and bilateral Secured at: 21 cm Tube secured with: Tape Dental Injury: Teeth and Oropharynx as per pre-operative assessment

## 2019-12-20 NOTE — H&P (Signed)
PREOPERATIVE H&P  Chief Complaint: Left leg pain  HPI: Jean Brennan is a 74 y.o. female who presents with ongoing pain in the left leg  MRI reveals stenosis and instability spanning L3-S1  Patient has failed multiple forms of conservative care and continues to have pain (see office notes for additional details regarding the patient's full course of treatment)  Past Medical History:  Diagnosis Date  . Arthritis    shoulders and left hip  . Dysrhythmia    Past Surgical History:  Procedure Laterality Date  . ABDOMINAL HYSTERECTOMY     '78 Hysterectomy  . CATARACT EXTRACTION, BILATERAL Bilateral   . COLONOSCOPY WITH PROPOFOL N/A 08/12/2015   Procedure: COLONOSCOPY WITH PROPOFOL;  Surgeon: Garlan Fair, MD;  Location: [facility];  Service: Endoscopy;  Laterality: N/A;  . TUBAL LIGATION     Social History   Socioeconomic History  . Marital status: Married    Spouse name: Not on file  . Number of children: Not on file  . Years of education: Not on file  . Highest education level: Not on file  Occupational History  . Not on file  Tobacco Use  . Smoking status: Former Smoker    Types: Cigarettes    Quit date: 08/05/1973    Years since quitting: 46.4  . Smokeless tobacco: Never Used  Vaping Use  . Vaping Use: Never used  Substance and Sexual Activity  . Alcohol use: Yes    Comment: occ to rare  . Drug use: No  . Sexual activity: Not on file  Other Topics Concern  . Not on file  Social History Narrative  . Not on file   Social Determinants of Health   Financial Resource Strain:   . Difficulty of Paying Living Expenses: Not on file  Food Insecurity:   . Worried About Charity fundraiser in the Last Year: Not on file  . Ran Out of Food in the Last Year: Not on file  Transportation Needs:   . Lack of Transportation (Medical): Not on file  . Lack of Transportation (Non-Medical): Not on file  Physical Activity:   . Days of Exercise per Week: Not on file   . Minutes of Exercise per Session: Not on file  Stress:   . Feeling of Stress : Not on file  Social Connections:   . Frequency of Communication with Friends and Family: Not on file  . Frequency of Social Gatherings with Friends and Family: Not on file  . Attends Religious Services: Not on file  . Active Member of Clubs or Organizations: Not on file  . Attends Archivist Meetings: Not on file  . Marital Status: Not on file   History reviewed. No pertinent family history. Allergies  Allergen Reactions  . Prednisone Rash    Blurred vision and flushed skin   Prior to Admission medications   Medication Sig Start Date End Date Taking? Authorizing Provider  Biotin 1000 MCG tablet Take 1,000 mcg by mouth daily.   Yes [provider]  Diclofenac Sodium (ASPERCREME ARTHRITIS PAIN EX) Apply 1 application topically daily as needed (pain).   Yes [provider]  HYDROcodone-acetaminophen (NORCO/VICODIN) 5-325 MG tablet Take 1 tablet by mouth daily as needed for pain. 11/20/19  Yes [provider]  Magnesium 250 MG TABS Take 250 mg by mouth daily.   Yes [provider]  Menthol-Methyl Salicylate (SALONPAS PAIN RELIEF PATCH EX) Apply 1 patch topically daily as needed (pain).  Yes [provider]  Multiple Vitamins-Minerals (CENTRUM SILVER 50+WOMEN) TABS Take 1 tablet by mouth daily.   Yes [provider]  valsartan-hydrochlorothiazide (DIOVAN-HCT) 80-12.5 MG tablet Take 1 tablet by mouth daily. 11/08/19  Yes [provider]     All other systems have been reviewed and were otherwise negative with the exception of those mentioned in the HPI and as above.  Physical Exam: Vitals:   12/20/19 0551  BP: 114/72  Pulse: 79  Resp: 18  Temp: 98.4 F (36.9 C)  SpO2: 96%    Body mass index is 30.42 kg/m.  General: Alert, no acute distress Cardiovascular: No pedal edema Respiratory: No cyanosis, no use of accessory  musculature Skin: No lesions in the area of chief complaint Neurologic: Sensation intact distally Psychiatric: Patient is competent for consent with normal mood and affect Lymphatic: No axillary or cervical lymphadenopathy  Assessment/Plan: LEFT-SIDED LUMBAR RADICULOPATHY Plan for Procedure(s): LEFT-SIDED LUMBAR 3-4, LUMBAR 4-5, LUMBAR 5-Sacrum 1 TRANSFORAMINAL LUMBAR INTERBODY FUSION WITH INSTRUMENTATION AND ALOGRAFT   Norva Karvonen, MD 12/20/2019 6:34 AM

## 2019-12-20 NOTE — Op Note (Signed)
PATIENT NAME: Jean Brennan   MEDICAL RECORD NO.:   [id number]    DATE OF BIRTH: Sep 07, 1945    DATE OF PROCEDURE: 12/20/2019                                 OPERATIVE REPORT     PREOPERATIVE DIAGNOSES: 1. Severe spinal stenosis L3-4, L4-5, L5-S1 2. Bilateral lumbar radiculopathy 3. L3-4, L4-5, L5-S1 spondylolisthesis   POSTOPERATIVE DIAGNOSES: 1. Severe spinal stenosis L3-4, L4-5, L5-S1 2. Bilateral lumbar radiculopathy 3. L3-4, L4-5, L5-S1 spondylolisthesis   PROCEDURES: 1. Lumbar decompression, L3-4, L4-5, L5-S1, including bilateral partial facetectomy, and bilateral lumbar decompression 2. Left-sided L3-4, L4-5, L5-S1 transforaminal lumbar interbody fusion. 3. Right-sided L3-4, L4-5, L5-S1 posterolateral fusion. 4. Insertion of interbody device x 3 (Concorde intervertebral spacers). 5. Placement of segmental posterior instrumentation L3, L4, L5, S1 bilaterally. 6. Use of local autograft. 7. Use of morselized allograft - ViviGen. 8. Intraoperative use of fluoroscopy.   SURGEON:  Phylliss Bob, MD.   ASSISTANTPricilla Holm, PA-C.   ANESTHESIA:  General endotracheal anesthesia.   COMPLICATIONS:  None.   DISPOSITION:  Stable.   ESTIMATED BLOOD LOSS:  300cc   INDICATIONS FOR SURGERY:  Briefly, Jean Brennan is a pleasant 74 y.o. -year-old Female who did present to me with severe and ongoing pain in the bilateral legs, left greater than right. I did feel that the symptoms were secondary to the findings noted above. The patient failed conservative care and did wish to proceed with the procedure  noted above.    OPERATIVE DETAILS:  On 12/20/2019, the patient was brought to surgery and general endotracheal anesthesia was administered.  The patient was placed prone on a well-padded flat Jackson bed with a spinal frame.  Antibiotics were given and a time-out procedure was performed. The back was prepped and draped in the usual fashion.  A midline incision was made  overlying the L3-4, L4-5, and L5-S1 intervertebral spaces.  The fascia was incised at the midline.  The paraspinal musculature was bluntly swept laterally.  Anatomic landmarks for the pedicles were exposed. Using fluoroscopy, I did cannulate the L3, L4, L5, and S1 pedicles bilaterally, using a medial to lateral cortical trajectory technique.  On the right side, the posterolateral gutter and facet joints at L3-4, L4-5, and L5-S1 were decorticated and 6 mm screws of the appropriate length were placed through the L3, L4, L5 and S1 pedicles and a 85-mm rod was placed and distraction was applied across the rod at each intervertebral level.  On the left side, the cannulated pedicle holes were filled with bone wax.  I then proceeded with the decompressive aspect of the procedure.     Starting at L5-S1, I did perform a laminectomy and a bilateral partial facetectomy.  I was able to thoroughly and entirely decompress the L5-S1 intervertebral space bilaterally, removing facet hypertrophy and ligamentum flavum hypertrophy. This portion of the procedure was meticulous, given the severe neuroforaminal stenosis particularly on the left at L5-S1. I was however able to thoroughly decompress the spinal canal and left L5-S1 neuroforamen. This did take significantly more work than the fusion portion of the procedure, by approximately 30 minutes. `At this point, with an assistant holding medial retraction of the traversing left S1 nerve, I did perform a thorough and complete L5-S1 intervertebral discectomy.  The intervertebral space was then liberally packed with autograft from the decompression, as well as allograft in the form  of ViviGen, as was the appropriately sized intervertebral spacer (7 mm).  Distraction was then released on the contralateral right side. I was very pleased with the press-fit of the spacer.  At this point, I turned my attention to the L4-5 level, I was able to thoroughly and entirely decompress the  L4-5 intervertebral space bilaterally, removing facet hypertrophy and ligamentum flavum hypertrophy. This portion of the procedure was also very meticulous, given the severe bilateral lateral recess stenosis. I was however able to thoroughly decompress the spinal canal at the L4-5 level. This did take significantly more work than the fusion portion of the procedure, also by approximately 30 minutes. At this point, with an assistant holding medial retraction of the traversing left L5 nerve, I did perform a thorough and complete L4-5 intervertebral discectomy.  The intervertebral space was then liberally packed with autograft from the decompression, as well as allograft in the form of ViviGen, as was the appropriately sized intervertebral spacer (10 mm, lordotic).  Distraction was then released on the contralateral right side. I was very pleased with the press-fit of the spacer.  At this point, I turned my attention to the L3-4 level, I was able to thoroughly and entirely decompress the L3-4 intervertebral space bilaterally, removing facet hypertrophy and ligamentum flavum hypertrophy. As previously noted, the decompression was again, very meticulous, as there was noted to be moderate to severe stenosis on both the right and left sides. I was however able to successfully decompress the right and left lateral recess, and was very pleased with the decompression. This did take significantly more work than the fusion portion of the procedure, by another 30 minutes. At this point, with an assistant holding medial retraction of the traversing left L4 nerve, I did perform a thorough and complete L3-4 intervertebral discectomy.  The intervertebral space was then liberally packed with autograft from the decompression, as well as allograft in the form of ViviGen, as was the appropriately sized intervertebral spacer (9 mm, lordotic).  Distraction was then released on the contralateral right side.     I then placed 6 mm  screws on the left at L3, L4, L5, and S1.  An 85-mm rod was then placed and caps were placed. All 8 caps were then locked.  The wound was copiously irrigated with a total of approximately 3 L prior to placing the bone graft.  Additional autograft and allograft were then packed into the posterolateral gutter on the right side to help aid in the success of the fusion.  The wound was  explored for any undue bleeding and there was no substantial bleeding encountered.  Gel-Foam was placed over the laminectomy site.  The wound was then closed in layers using #1 Vicryl followed by 2-0 Vicryl, followed by 4-0 Monocryl.  Benzoin and Steri-Strips were applied followed by sterile dressing.     Of note, I did use EMG and SSEP testing throughout the procedure. There is no abnormal SSEP or EMG activity throughout the surgery from start to finish.    Of note, Pricilla Holm was my assistant throughout surgery, and did aid in retraction, the decompression, placement of the hardware, suctioning, and closure.     Phylliss Bob, MD

## 2019-12-21 ENCOUNTER — Encounter (HOSPITAL_COMMUNITY): Payer: Self-pay | Admitting: Orthopedic Surgery

## 2019-12-21 DIAGNOSIS — M48061 Spinal stenosis, lumbar region without neurogenic claudication: Secondary | ICD-10-CM | POA: Diagnosis not present

## 2019-12-21 MED ORDER — METHOCARBAMOL 500 MG PO TABS: 500.0000 mg | ORAL_TABLET | Freq: Four times a day (QID) | ORAL | 1 refills | Status: AC | PRN

## 2019-12-21 MED ORDER — OXYCODONE-ACETAMINOPHEN 5-325 MG PO TABS
1.0000 | ORAL_TABLET | ORAL | 0 refills | Status: AC | PRN
Start: 2019-12-21 — End: ?

## 2019-12-21 MED FILL — Thrombin (Recombinant) For Soln 20000 Unit: CUTANEOUS | Qty: 1 | Status: AC

## 2019-12-21 NOTE — Progress Notes (Signed)
    Patient doing well  Denies leg pain Has been ambulating   Physical Exam: Vitals:   12/21/19 0006 12/21/19 0336  BP: 111/69 118/61  Pulse: 87 94  Resp: 18 18  Temp: 98.2 F (36.8 C) (!) 100.5 F (38.1 C)  SpO2: 94% 96%    Dressing in place NVI  POD #1 s/p L3-S1 decompression and fusion, doing well  - up with PT/OT, encourage ambulation - Percocet for pain, Robaxin for muscle spasms - likely d/c home today with f/u in 2 weeks

## 2019-12-21 NOTE — Evaluation (Signed)
Physical Therapy Evaluation Patient Details Name: Jean Brennan MRN: [id number] DOB: 1945/10/20 Today's Date: 12/21/2019   History of Present Illness  74 y.o. female presenting with ongoing pain in Lft leg 2/2 stenosis and instability spanning L3-S1. Patient s/p L Lumbar 3-4, 4-5 and L5-S1 TLIF on 12/2 by Dr. Lynann Bologna. PMHx unknown.  Clinical Impression  Patient presents with pain and post surgical deficits s/p above surgery. Pt lives at home with disabled spouse and reports being independent for ADLs/IADLs PTA. Today, pt requires Min A to stand from low surfaces and min guard assist for gait training with use of RW for support. Reports pain in anterior bilateral thighs and through back. Pt with slow processing at times needing repetition to follow commands/adhere to precautions. Education re: back precautions, log roll technique, car transfer, walking program, brace etc. Will follow acutely to maximize independence and mobility prior to return home.    Follow Up Recommendations Home health PT;Supervision for mobility/OOB    Equipment Recommendations  Rolling walker with 5" wheels    Recommendations for Other Services       Precautions / Restrictions Precautions Precautions: Back;Fall Precaution Booklet Issued: Yes (comment) Precaution Comments: Reviewed precautions and handout Required Braces or Orthoses: Spinal Brace Spinal Brace: Thoracolumbosacral orthotic;Applied in sitting position Restrictions Weight Bearing Restrictions: No      Mobility  Bed Mobility Overal bed mobility: Needs Assistance Bed Mobility: Rolling;Sidelying to Sit Rolling: Supervision Sidelying to sit: Min guard       General bed mobility comments: Sitting in chair upon PT arrival.    Transfers Overall transfer level: Needs assistance Equipment used: Rolling walker (2 wheeled) Transfers: Sit to/from Stand Sit to Stand: Min assist;Min guard Stand pivot transfers: Min guard       General  transfer comment: Assist to power to standing from low chair as well as help scooting forward in chair, pt fearful of moving. Stood from [employer], from toilet x1.  Ambulation/Gait Ambulation/Gait assistance: Min guard Gait Distance (Feet): 200 Feet Assistive device: Rolling walker (2 wheeled) Gait Pattern/deviations: Step-through pattern;Decreased stride length;Trunk flexed Gait velocity: decreased Gait velocity interpretation: <1.8 ft/sec, indicate of risk for recurrent falls General Gait Details: Slow, mostly steady gait with RW for support; cues for upright posture.  Stairs            Wheelchair Mobility    Modified Rankin (Stroke Patients Only)       Balance Overall balance assessment: Needs assistance Sitting-balance support: Feet supported;No upper extremity supported Sitting balance-Leahy Scale: Good Sitting balance - Comments: ASsist to adjust brace due to it digging into her neck sitting in chair   Standing balance support: During functional activity Standing balance-Leahy Scale: Fair Standing balance comment: Patient able to maintain standing balance without UE support during washing hands at sink and donning/doffing pants.                             Pertinent Vitals/Pain Pain Assessment: 0-10 Pain Score: 8  Pain Location: Low back and bilateral legs Pain Descriptors / Indicators: Aching;Grimacing;Guarding;Sore Pain Intervention(s): Monitored during session;Repositioned;Patient requesting pain meds-RN notified    Home Living Family/patient expects to be discharged to:: Private residence Living Arrangements: Spouse/significant other Available Help at Discharge: Family;Available PRN/intermittently Type of Home: House Home Access: Ramped entrance     Home Layout: One level Home Equipment: Grab bars - tub/shower;Hand held shower head      Prior Function Level of Independence: Independent  Comments: Independent with ADLs/IADLs and  was caring for her mother and spouse. Patient was still driving and ambulating in home/community dwellings without use of AD.      Hand Dominance   Dominant Hand: Right    Extremity/Trunk Assessment   Upper Extremity Assessment Upper Extremity Assessment: Defer to OT evaluation    Lower Extremity Assessment Lower Extremity Assessment: Generalized weakness;RLE deficits/detail;LLE deficits/detail (but functional) RLE Deficits / Details: pain in thighs LLE Deficits / Details: pain in thighs    Cervical / Trunk Assessment Cervical / Trunk Assessment: Other exceptions Cervical / Trunk Exceptions: s/p back surgery  Communication   Communication: No difficulties  Cognition Arousal/Alertness: Awake/alert Behavior During Therapy: WFL for tasks assessed/performed Overall Cognitive Status: No family/caregiver present to determine baseline cognitive functioning Area of Impairment: Problem solving                     Memory: Decreased short-term memory;Decreased recall of precautions       Problem Solving: Slow processing;Requires verbal cues General Comments: Patient required increased time for processing verbal information requiring repetition. Able to recall info from OT session.      General Comments      Exercises     Assessment/Plan    PT Assessment Patient needs continued PT services  PT Problem List Decreased mobility;Decreased strength;Pain;Decreased skin integrity;Decreased cognition;Decreased activity tolerance;Decreased balance       PT Treatment Interventions Therapeutic activities;Gait training;Therapeutic exercise;Patient/family education;Balance training;Functional mobility training    PT Goals (Current goals can be found in the Care Plan section)  Acute Rehab PT Goals Patient Stated Goal: to go home and return to PLOF PT Goal Formulation: With patient Time For Goal Achievement: 01/04/20 Potential to Achieve Goals: Good    Frequency Min  5X/week   Barriers to discharge        Co-evaluation               AM-PAC PT "6 Clicks" Mobility  Outcome Measure Help needed turning from your back to your side while in a flat bed without using bedrails?: A Little Help needed moving from lying on your back to sitting on the side of a flat bed without using bedrails?: A Little Help needed moving to and from a bed to a chair (including a wheelchair)?: A Little Help needed standing up from a chair using your arms (e.g., wheelchair or bedside chair)?: A Little Help needed to walk in hospital room?: A Little Help needed climbing 3-5 steps with a railing? : A Little 6 Click Score: 18    End of Session Equipment Utilized During Treatment: Back brace;Gait belt Activity Tolerance: Patient tolerated treatment well;Patient limited by pain Patient left: in chair;with call bell/phone within reach Nurse Communication: Mobility status PT Visit Diagnosis: Pain;Difficulty in walking, not elsewhere classified (R26.2) Pain - Right/Left:  (bil) Pain - part of body: Leg (back)    Time: 0037-0488 PT Time Calculation (min) (ACUTE ONLY): 25 min   Charges:   PT Evaluation $PT Eval Moderate Complexity: 1 Mod PT Treatments $Gait Training: 8-22 mins        Marisa Severin, PT, DPT Acute Rehabilitation Services Pager [phone] Office [phone]      Jean Brennan 12/21/2019, 10:31 AM

## 2019-12-21 NOTE — Evaluation (Signed)
Occupational Therapy Evaluation Patient Details Name: Jean Brennan MRN: [id number] DOB: 07-Aug-1945 Today's Date: 12/21/2019    History of Present Illness 74 y.o. female presenting with ongoing pain in L leg 2/2 stenosis and instability spanning L3-S1. Patient s/p L Lumbar 3-4, 4-5 and L5-S1 TLIF on 12/2 by Dr. Lynann Bologna. PMHx unknown.    Clinical Impression   PTA patient was living with her spouse in a single-level private residence and was independent with BADLs/IADLs including meal prep, driving, and grocery shopping. Patient currently presents below baseline level of function requiring Min A for LB dressing, and supervision to Dunn guard for bed mobility, ADL transfers, and household mobility with use of RW. Education provided on back precautions, donning/doffing TLSO, safe use of AE/DME to maximize independnece and decrease need for assistance, and home set-up for safety. Patient limited by increased need for time to process verbal information, increased time/effor to complete tasks, and pain. Patient would benefit from [facility] to maximize safety and independence with ADLs/IADLs.     Follow Up Recommendations  Home health OT;Supervision/Assistance - 24 hour    Equipment Recommendations  3 in 1 bedside commode    Recommendations for Other Services       Precautions / Restrictions Precautions Precautions: Back;Fall Precaution Booklet Issued: Yes (comment) Required Braces or Orthoses: Spinal Brace Spinal Brace: Thoracolumbosacral orthotic;Applied in sitting position Restrictions Weight Bearing Restrictions: No      Mobility Bed Mobility Overal bed mobility: Needs Assistance Bed Mobility: Rolling;Sidelying to Sit Rolling: Supervision Sidelying to sit: Min guard       General bed mobility comments: Supervision A for rolling to L and Min gaurd for side-lying to sit wtih cues for hand placement and adherence to back precautions.     Transfers Overall transfer level: Needs  assistance Equipment used: Rolling walker (2 wheeled) Transfers: Sit to/from [facility] Sit to Stand: Min guard;Supervision Stand pivot transfers: Min guard       General transfer comment: Sit to stand with supervision from elevated surfaces and Min guard for steadying from low surfaces.     Balance Overall balance assessment: Needs assistance Sitting-balance support: No upper extremity supported;Feet supported Sitting balance-Leahy Scale: Good     Standing balance support: Bilateral upper extremity supported;During functional activity Standing balance-Leahy Scale: Fair Standing balance comment: Patient able to maintain standing balance without UE support during hygiene/clothing management after toileting.                            ADL either performed or assessed with clinical judgement   ADL Overall ADL's : Needs assistance/impaired     Grooming: Wash/dry hands;Wash/dry face;Standing;Supervision/safety Grooming Details (indicate cue type and reason): Patient completed 2/3 grooming tasks standing at sink level with RW and cues for adherence to back preacutions.          Upper Body Dressing : Set up;Sitting   Lower Body Dressing: Minimal assistance;Sit to/from stand Lower Body Dressing Details (indicate cue type and reason): Min A with use of AD to don underwear/pants seated EOB.  Toilet Transfer: Designer, television/film set Details (indicate cue type and reason): Min guard for toilet transfer with cues for adherence to back precautions and safety with RW.  Toileting- Clothing Manipulation and Hygiene: Minimal assistance Toileting - Clothing Manipulation Details (indicate cue type and reason): Education on use of toilet tongues for hygiene management as patient was unable to reach front perineal area.      Functional mobility  during ADLs: Min guard;Supervision/safety;Rolling walker General ADL Comments: Initially min guard but progressed to  supervision with use of RW.      Vision Baseline Vision/History: Wears glasses Wears Glasses: At all times Patient Visual Report: No change from baseline Vision Assessment?: No apparent visual deficits     Perception     Praxis      Pertinent Vitals/Pain Pain Assessment: 0-10 Pain Score: 5  Pain Location: Low back and bilateral legs Pain Descriptors / Indicators: Aching;Grimacing;Guarding;Sore Pain Intervention(s): Monitored during session;Premedicated before session;Repositioned     Hand Dominance Right   Extremity/Trunk Assessment Upper Extremity Assessment Upper Extremity Assessment: Overall WFL for tasks assessed       Cervical / Trunk Assessment Cervical / Trunk Assessment: Normal   Communication Communication Communication: No difficulties   Cognition Arousal/Alertness: Awake/alert Behavior During Therapy: WFL for tasks assessed/performed Overall Cognitive Status: No family/caregiver present to determine baseline cognitive functioning Area of Impairment: Memory                     Memory: Decreased short-term memory;Decreased recall of precautions         General Comments: Patient required increased time for processing verbal information requiring repeat reminders. Patient also demonstrates poor cognitive and functional organization.     General Comments       Exercises     Shoulder Instructions      Home Living Family/patient expects to be discharged to:: Private residence Living Arrangements: Spouse/significant other Available Help at Discharge: Family;Available PRN/intermittently Type of Home: House Home Access: Ramped entrance     Home Layout: One level     Bathroom Shower/Tub: Occupational psychologist: Standard     Home Equipment: Grab bars - tub/shower;Hand held shower head (Built in shower seat)          Prior Functioning/Environment Level of Independence: Independent        Comments: Independent with  ADLs/IADLs and was caring for her mother and spouse. Patient was still driving and ambulating in home/community dwellings without use of AD.         OT Problem List: Decreased cognition;Decreased knowledge of use of DME or AE;Decreased knowledge of precautions;Pain      OT Treatment/Interventions:      OT Goals(Current goals can be found in the care plan section) Acute Rehab OT Goals Patient Stated Goal: To return home OT Goal Formulation: All assessment and education complete, DC therapy  OT Frequency:     Barriers to D/C:            Co-evaluation              AM-PAC OT "6 Clicks" Daily Activity     Outcome Measure Help from another person eating meals?: None Help from another person taking care of personal grooming?: A Little Help from another person toileting, which includes using toliet, bedpan, or urinal?: A Little Help from another person bathing (including washing, rinsing, drying)?: A Little Help from another person to put on and taking off regular upper body clothing?: A Little Help from another person to put on and taking off regular lower body clothing?: A Little 6 Click Score: 19   End of Session Equipment Utilized During Treatment: Gait belt;Rolling walker;Back brace Nurse Communication: Mobility status;Other (comment) (DME needs)  Activity Tolerance: Patient tolerated treatment well Patient left: in chair;with call bell/phone within reach  OT Visit Diagnosis: Pain Pain - part of body:  (Back and bilateral LE)  Time: 9847-3085 OT Time Calculation (min): 40 min Charges:  OT General Charges $OT Visit: 1 Visit OT Evaluation $OT Eval Moderate Complexity: 1 Mod OT Treatments $Self Care/Home Management : 23-37 mins  Seara Hinesley H. OTR/L Supplemental OT, Department of rehab services [phone]  Teige Rountree R H. 12/21/2019, 9:02 AM

## 2019-12-21 NOTE — Care Management CC44 (Signed)
Condition Code 44 Documentation Completed  Patient Details  Name: Koya Hunger MRN: [id number] Date of Birth: [birth year]   Condition Code 44 given:  Yes Patient signature on Condition Code 44 notice:  Yes Documentation of 2 MD's agreement:  Yes Code 44 added to claim:  Yes    Marilu Favre, RN 12/21/2019, 8:53 AM

## 2019-12-21 NOTE — Plan of Care (Signed)
Patient alert and oriented, mae's well, voiding adequate amount of urine, swallowing without difficulty, no c/o pain at time of discharge. Patient discharged home with family. Script and discharged instructions given to patient. Patient and family stated understanding of instructions given. Patient has an appointment with Dr. Dumonski 

## 2019-12-21 NOTE — Care Management Obs Status (Signed)
[place] NOTIFICATION   Patient Details  Name: Jean Brennan MRN: [id number] Date of Birth: Mar 28, 1945   Medicare Observation Status Notification Given:  Yes    Marilu Favre, RN 12/21/2019, 8:53 AM

## 2019-12-23 NOTE — Anesthesia Postprocedure Evaluation (Signed)
Anesthesia Post Note  Patient: Jean Brennan  Procedure(s) Performed: LEFT-SIDED LUMBAR THREE THROUGH FOUR, LUMBAR FOUR THROUGH FIVE, LUMBAR FIVE THROUGH SACRAL TRANSFORAMINAL LUMBAR INTERBODY FUSION WITH INSTRUMENTATION AND ALOGRAFT (Left )     Patient location during evaluation: PACU Anesthesia Type: General Level of consciousness: awake and alert Pain management: pain level controlled Vital Signs Assessment: post-procedure vital signs reviewed and stable Respiratory status: spontaneous breathing, nonlabored ventilation, respiratory function stable and patient connected to nasal cannula oxygen Cardiovascular status: blood pressure returned to baseline and stable Postop Assessment: no apparent nausea or vomiting Anesthetic complications: no   No complications documented.  Last Vitals:  Vitals:   12/21/19 0336 12/21/19 0803  BP: 118/61 126/72  Pulse: 94 (!) 109  Resp: 18 18  Temp: (!) 38.1 C 37 C  SpO2: 96% 93%    Last Pain:  Vitals:   12/21/19 1448  TempSrc:   PainSc: 3                  Leonora Gores COKER

## 2019-12-25 MED FILL — Heparin Sodium (Porcine) Inj 1000 Unit/ML: INTRAMUSCULAR | Qty: 30 | Status: AC

## 2019-12-25 MED FILL — Sodium Chloride IV Soln 0.9%: INTRAVENOUS | Qty: 1000 | Status: AC

## 2020-01-01 ENCOUNTER — Encounter (HOSPITAL_COMMUNITY): Payer: Self-pay | Admitting: Orthopedic Surgery

## 2020-01-02 NOTE — Discharge Summary (Signed)
Patient ID: Jean Brennan MRN: [id number] DOB/AGE: October 04, 1945 74 y.o.  Admit date: 12/20/2019 Discharge date: 12/21/2019  Admission Diagnoses:  Active Problems:   Spinal stenosis   Discharge Diagnoses:  Same  Past Medical History:  Diagnosis Date  . Arthritis    shoulders and left hip  . Dysrhythmia     Surgeries: Procedure(s): LEFT-SIDED LUMBAR THREE THROUGH FOUR, LUMBAR FOUR THROUGH FIVE, LUMBAR FIVE THROUGH SACRAL TRANSFORAMINAL LUMBAR INTERBODY FUSION WITH INSTRUMENTATION AND ALOGRAFT on 12/20/2019   Consultants: none  Discharged Condition: Improved  Hospital Course: Jean Brennan is an 74 y.o. female who was admitted 12/20/2019 for operative treatment of spinal stenosis. Patient has severe unremitting pain that affects sleep, daily activities, and work/hobbies. After pre-op clearance the patient was taken to the operating room on 12/20/2019 and underwent  Procedure(s): [name], LUMBAR FIVE THROUGH SACRAL TRANSFORAMINAL LUMBAR INTERBODY FUSION WITH INSTRUMENTATION AND ALOGRAFT.    Patient was given perioperative antibiotics:  Anti-infectives (From admission, onward)   Start     Dose/Rate Route Frequency Ordered Stop   12/20/19 2200  ceFAZolin (ANCEF) IVPB 2g/100 mL premix        2 g 200 mL/hr over 30 Minutes Intravenous Every 8 hours 12/20/19 1552 12/21/19 0529   12/20/19 0600  ceFAZolin (ANCEF) IVPB 2g/100 mL premix        2 g 200 mL/hr over 30 Minutes Intravenous On call to O.R. 12/20/19 0558 12/20/19 1341       Patient was given sequential compression devices, early ambulation to prevent DVT.  Patient benefited maximally from hospital stay and there were no complications.    Recent vital signs: BP 126/72 (BP Location: Left Arm)   Pulse (!) 109   Temp 98.6 F (37 C) (Oral)   Resp 18   Ht 5\' 4"  (1.626 m)   Wt 80.4 kg   SpO2 93%   BMI 30.42 kg/m   Discharge Medications:   Allergies as of  12/21/2019      Reactions   Prednisone Rash   Blurred vision and flushed skin      Medication List    TAKE these medications   ASPERCREME ARTHRITIS PAIN EX Apply 1 application topically daily as needed (pain).   Biotin 1000 MCG tablet Take 1,000 mcg by mouth daily.   Centrum Silver 50+Women Tabs Take 1 tablet by mouth daily.   Magnesium 250 MG Tabs Take 250 mg by mouth daily.   methocarbamol 500 MG tablet Commonly known as: ROBAXIN Take 1 tablet (500 mg total) by mouth every 6 (six) hours as needed for muscle spasms.   oxyCODONE-acetaminophen 5-325 MG tablet Commonly known as: PERCOCET/ROXICET Take 1-2 tablets by mouth every 4 (four) hours as needed for moderate pain or severe pain.   SALONPAS PAIN RELIEF PATCH EX Apply 1 patch topically daily as needed (pain).   valsartan-hydrochlorothiazide 80-12.5 MG tablet Commonly known as: DIOVAN-HCT Take 1 tablet by mouth daily.       Diagnostic Studies: DG Lumbar Spine 2-3 Views  Result Date: 12/20/2019 CLINICAL DATA:  Lumbar surgery. EXAM: LUMBAR SPINE - 2-3 VIEW; DG C-ARM 1-60 MIN COMPARISON:  10/12/2019. FINDINGS: Lumbar spine numbered the lowest segmented appearing lumbar shaped vertebrae on lateral view as L5. L3 through S1 posterior interbody fusion. Hardware intact. Anatomic alignment. IMPRESSION: L3 through S1 posterior interbody fusion. Hardware intact. Anatomic alignment. Electronically Signed   By: [place]   On: 12/20/2019 13:50   DG Lumbar Spine 1  View  Result Date: 12/20/2019 CLINICAL DATA:  Lumbar surgery. EXAM: LUMBAR SPINE - 1 VIEW COMPARISON:  No prior. FINDINGS: Lumbar spine numbered the lowest segmented appearing lumbar shaped vertebrae on lateral view as L5. Metallic markers noted posteriorly at the L3 and S1 level diffuse degenerative change. Anterolisthesis L4 on L5 again noted. IMPRESSION: 1. Metallic markers noted posteriorly at the L3 and S1 level. 2. Diffuse degenerative change. Anterolisthesis L4  on L5 again noted. Electronically Signed   By: Marcello Moores  Register   On: 12/20/2019 13:49   DG C-Arm 1-60 Min  Result Date: 12/20/2019 CLINICAL DATA:  Lumbar surgery. EXAM: LUMBAR SPINE - 2-3 VIEW; DG C-ARM 1-60 MIN COMPARISON:  10/12/2019. FINDINGS: Lumbar spine numbered the lowest segmented appearing lumbar shaped vertebrae on lateral view as L5. L3 through S1 posterior interbody fusion. Hardware intact. Anatomic alignment. IMPRESSION: L3 through S1 posterior interbody fusion. Hardware intact. Anatomic alignment. Electronically Signed   By: Marcello Moores  Register   On: 12/20/2019 13:50    Disposition: Discharge disposition: 01-Home or Self Care         POD #1 s/p L3-S1 decompression and fusion, doing well  - up with PT/OT, encourage ambulation - Percocet for pain, Robaxin for muscle spasms -Scripts for pain sent to pharmacy electronically  -D/C instructions sheet printed and in chart -D/C today  -F/U in office 2 weeks   Signed: Lennie Muckle Kimorah Ridolfi 01/02/2020, 12:52 PM

## 2020-01-28 DIAGNOSIS — M545 Low back pain, unspecified: Secondary | ICD-10-CM | POA: Diagnosis not present

## 2020-01-28 DIAGNOSIS — Z9889 Other specified postprocedural states: Secondary | ICD-10-CM | POA: Diagnosis not present

## 2020-02-01 ENCOUNTER — Other Ambulatory Visit: Payer: Self-pay | Admitting: Internal Medicine

## 2020-02-01 DIAGNOSIS — Z1231 Encounter for screening mammogram for malignant neoplasm of breast: Secondary | ICD-10-CM

## 2020-03-10 DIAGNOSIS — Z4789 Encounter for other orthopedic aftercare: Secondary | ICD-10-CM | POA: Diagnosis not present

## 2020-03-10 DIAGNOSIS — Z981 Arthrodesis status: Secondary | ICD-10-CM | POA: Diagnosis not present

## 2020-03-12 DIAGNOSIS — R229 Localized swelling, mass and lump, unspecified: Secondary | ICD-10-CM | POA: Diagnosis not present

## 2020-03-12 DIAGNOSIS — M47816 Spondylosis without myelopathy or radiculopathy, lumbar region: Secondary | ICD-10-CM | POA: Diagnosis not present

## 2020-03-12 DIAGNOSIS — I10 Essential (primary) hypertension: Secondary | ICD-10-CM | POA: Diagnosis not present

## 2020-03-12 DIAGNOSIS — L989 Disorder of the skin and subcutaneous tissue, unspecified: Secondary | ICD-10-CM | POA: Diagnosis not present

## 2020-03-18 ENCOUNTER — Other Ambulatory Visit: Payer: Self-pay

## 2020-03-18 ENCOUNTER — Ambulatory Visit
Admission: RE | Admit: 2020-03-18 | Discharge: 2020-03-18 | Disposition: A | Discharge status: Home / Self Care | Payer: Medicare Other | Source: Ambulatory Visit | Attending: Internal Medicine | Admitting: Internal Medicine

## 2020-03-18 DIAGNOSIS — Z1231 Encounter for screening mammogram for malignant neoplasm of breast: Secondary | ICD-10-CM

## 2020-03-25 DIAGNOSIS — Z9889 Other specified postprocedural states: Secondary | ICD-10-CM | POA: Diagnosis not present

## 2020-03-25 DIAGNOSIS — M5416 Radiculopathy, lumbar region: Secondary | ICD-10-CM | POA: Diagnosis not present

## 2020-03-25 DIAGNOSIS — M6281 Muscle weakness (generalized): Secondary | ICD-10-CM | POA: Diagnosis not present

## 2020-04-01 DIAGNOSIS — Z9889 Other specified postprocedural states: Secondary | ICD-10-CM | POA: Diagnosis not present

## 2020-04-01 DIAGNOSIS — M6281 Muscle weakness (generalized): Secondary | ICD-10-CM | POA: Diagnosis not present

## 2020-04-01 DIAGNOSIS — M5416 Radiculopathy, lumbar region: Secondary | ICD-10-CM | POA: Diagnosis not present

## 2020-04-03 DIAGNOSIS — M5416 Radiculopathy, lumbar region: Secondary | ICD-10-CM | POA: Diagnosis not present

## 2020-04-03 DIAGNOSIS — M6281 Muscle weakness (generalized): Secondary | ICD-10-CM | POA: Diagnosis not present

## 2020-04-03 DIAGNOSIS — Z9889 Other specified postprocedural states: Secondary | ICD-10-CM | POA: Diagnosis not present

## 2020-04-08 DIAGNOSIS — M5416 Radiculopathy, lumbar region: Secondary | ICD-10-CM | POA: Diagnosis not present

## 2020-04-08 DIAGNOSIS — M6281 Muscle weakness (generalized): Secondary | ICD-10-CM | POA: Diagnosis not present

## 2020-04-08 DIAGNOSIS — Z9889 Other specified postprocedural states: Secondary | ICD-10-CM | POA: Diagnosis not present

## 2020-04-15 DIAGNOSIS — M5416 Radiculopathy, lumbar region: Secondary | ICD-10-CM | POA: Diagnosis not present

## 2020-04-15 DIAGNOSIS — M6281 Muscle weakness (generalized): Secondary | ICD-10-CM | POA: Diagnosis not present

## 2020-04-15 DIAGNOSIS — Z9889 Other specified postprocedural states: Secondary | ICD-10-CM | POA: Diagnosis not present

## 2020-04-28 DIAGNOSIS — M5416 Radiculopathy, lumbar region: Secondary | ICD-10-CM | POA: Diagnosis not present

## 2020-04-28 DIAGNOSIS — M6281 Muscle weakness (generalized): Secondary | ICD-10-CM | POA: Diagnosis not present

## 2020-04-28 DIAGNOSIS — Z9889 Other specified postprocedural states: Secondary | ICD-10-CM | POA: Diagnosis not present

## 2020-05-01 DIAGNOSIS — Z9889 Other specified postprocedural states: Secondary | ICD-10-CM | POA: Diagnosis not present

## 2020-05-01 DIAGNOSIS — M6281 Muscle weakness (generalized): Secondary | ICD-10-CM | POA: Diagnosis not present

## 2020-05-01 DIAGNOSIS — M5416 Radiculopathy, lumbar region: Secondary | ICD-10-CM | POA: Diagnosis not present

## 2020-05-06 DIAGNOSIS — Z9889 Other specified postprocedural states: Secondary | ICD-10-CM | POA: Diagnosis not present

## 2020-05-06 DIAGNOSIS — M5416 Radiculopathy, lumbar region: Secondary | ICD-10-CM | POA: Diagnosis not present

## 2020-05-06 DIAGNOSIS — M6281 Muscle weakness (generalized): Secondary | ICD-10-CM | POA: Diagnosis not present

## 2020-05-08 DIAGNOSIS — M5416 Radiculopathy, lumbar region: Secondary | ICD-10-CM | POA: Diagnosis not present

## 2020-05-08 DIAGNOSIS — Z9889 Other specified postprocedural states: Secondary | ICD-10-CM | POA: Diagnosis not present

## 2020-05-08 DIAGNOSIS — M6281 Muscle weakness (generalized): Secondary | ICD-10-CM | POA: Diagnosis not present

## 2020-05-14 DIAGNOSIS — M5416 Radiculopathy, lumbar region: Secondary | ICD-10-CM | POA: Diagnosis not present

## 2020-05-14 DIAGNOSIS — Z9889 Other specified postprocedural states: Secondary | ICD-10-CM | POA: Diagnosis not present

## 2020-05-14 DIAGNOSIS — M6281 Muscle weakness (generalized): Secondary | ICD-10-CM | POA: Diagnosis not present

## 2020-05-19 DIAGNOSIS — M5416 Radiculopathy, lumbar region: Secondary | ICD-10-CM | POA: Diagnosis not present

## 2020-05-19 DIAGNOSIS — Z9889 Other specified postprocedural states: Secondary | ICD-10-CM | POA: Diagnosis not present

## 2020-05-19 DIAGNOSIS — M6281 Muscle weakness (generalized): Secondary | ICD-10-CM | POA: Diagnosis not present

## 2020-05-21 DIAGNOSIS — M6281 Muscle weakness (generalized): Secondary | ICD-10-CM | POA: Diagnosis not present

## 2020-05-21 DIAGNOSIS — Z9889 Other specified postprocedural states: Secondary | ICD-10-CM | POA: Diagnosis not present

## 2020-05-21 DIAGNOSIS — M5416 Radiculopathy, lumbar region: Secondary | ICD-10-CM | POA: Diagnosis not present

## 2020-06-06 DIAGNOSIS — D1721 Benign lipomatous neoplasm of skin and subcutaneous tissue of right arm: Secondary | ICD-10-CM | POA: Diagnosis not present

## 2020-06-06 DIAGNOSIS — D2262 Melanocytic nevi of left upper limb, including shoulder: Secondary | ICD-10-CM | POA: Diagnosis not present

## 2020-06-06 DIAGNOSIS — L820 Inflamed seborrheic keratosis: Secondary | ICD-10-CM | POA: Diagnosis not present

## 2020-06-06 DIAGNOSIS — D485 Neoplasm of uncertain behavior of skin: Secondary | ICD-10-CM | POA: Diagnosis not present

## 2020-06-06 DIAGNOSIS — D225 Melanocytic nevi of trunk: Secondary | ICD-10-CM | POA: Diagnosis not present

## 2020-06-09 DIAGNOSIS — M545 Low back pain, unspecified: Secondary | ICD-10-CM | POA: Diagnosis not present

## 2020-08-01 DIAGNOSIS — M533 Sacrococcygeal disorders, not elsewhere classified: Secondary | ICD-10-CM | POA: Diagnosis not present

## 2020-08-29 DIAGNOSIS — H43813 Vitreous degeneration, bilateral: Secondary | ICD-10-CM | POA: Diagnosis not present

## 2020-08-29 DIAGNOSIS — H18413 Arcus senilis, bilateral: Secondary | ICD-10-CM | POA: Diagnosis not present

## 2020-08-29 DIAGNOSIS — H04123 Dry eye syndrome of bilateral lacrimal glands: Secondary | ICD-10-CM | POA: Diagnosis not present

## 2020-08-29 DIAGNOSIS — Z961 Presence of intraocular lens: Secondary | ICD-10-CM | POA: Diagnosis not present

## 2020-09-02 DIAGNOSIS — Z1389 Encounter for screening for other disorder: Secondary | ICD-10-CM | POA: Diagnosis not present

## 2020-09-02 DIAGNOSIS — Z0000 Encounter for general adult medical examination without abnormal findings: Secondary | ICD-10-CM | POA: Diagnosis not present

## 2020-09-02 DIAGNOSIS — M47816 Spondylosis without myelopathy or radiculopathy, lumbar region: Secondary | ICD-10-CM | POA: Diagnosis not present

## 2020-09-02 DIAGNOSIS — I10 Essential (primary) hypertension: Secondary | ICD-10-CM | POA: Diagnosis not present

## 2020-09-02 DIAGNOSIS — M5432 Sciatica, left side: Secondary | ICD-10-CM | POA: Diagnosis not present

## 2020-09-02 DIAGNOSIS — M8580 Other specified disorders of bone density and structure, unspecified site: Secondary | ICD-10-CM | POA: Diagnosis not present

## 2020-09-02 DIAGNOSIS — D1721 Benign lipomatous neoplasm of skin and subcutaneous tissue of right arm: Secondary | ICD-10-CM | POA: Diagnosis not present

## 2020-09-24 DIAGNOSIS — M85851 Other specified disorders of bone density and structure, right thigh: Secondary | ICD-10-CM | POA: Diagnosis not present

## 2020-09-24 DIAGNOSIS — M85852 Other specified disorders of bone density and structure, left thigh: Secondary | ICD-10-CM | POA: Diagnosis not present

## 2020-09-24 DIAGNOSIS — Z780 Asymptomatic menopausal state: Secondary | ICD-10-CM | POA: Diagnosis not present

## 2020-10-23 DIAGNOSIS — U071 COVID-19: Secondary | ICD-10-CM | POA: Diagnosis not present

## 2020-12-17 DIAGNOSIS — Z23 Encounter for immunization: Secondary | ICD-10-CM | POA: Diagnosis not present

## 2021-02-12 ENCOUNTER — Other Ambulatory Visit: Payer: Self-pay | Admitting: Internal Medicine

## 2021-02-12 DIAGNOSIS — Z1231 Encounter for screening mammogram for malignant neoplasm of breast: Secondary | ICD-10-CM

## 2021-03-23 ENCOUNTER — Ambulatory Visit
Admission: RE | Admit: 2021-03-23 | Discharge: 2021-03-23 | Disposition: A | Discharge status: Home / Self Care | Payer: Medicare Other | Source: Ambulatory Visit | Attending: Internal Medicine | Admitting: Internal Medicine

## 2021-03-23 ENCOUNTER — Other Ambulatory Visit: Payer: Self-pay

## 2021-03-23 DIAGNOSIS — Z1231 Encounter for screening mammogram for malignant neoplasm of breast: Secondary | ICD-10-CM | POA: Diagnosis not present

## 2021-04-14 DIAGNOSIS — I10 Essential (primary) hypertension: Secondary | ICD-10-CM | POA: Diagnosis not present

## 2021-09-01 DIAGNOSIS — H43813 Vitreous degeneration, bilateral: Secondary | ICD-10-CM | POA: Diagnosis not present

## 2021-09-01 DIAGNOSIS — H04123 Dry eye syndrome of bilateral lacrimal glands: Secondary | ICD-10-CM | POA: Diagnosis not present

## 2021-09-01 DIAGNOSIS — H18413 Arcus senilis, bilateral: Secondary | ICD-10-CM | POA: Diagnosis not present

## 2021-09-01 DIAGNOSIS — Z961 Presence of intraocular lens: Secondary | ICD-10-CM | POA: Diagnosis not present

## 2021-09-01 IMAGING — RF DG LUMBAR SPINE 2-3V
1 series · 2 of 2 positions shown · non-contrast
Comparison: 10/12/2019.

CLINICAL DATA: Lumbar surgery.

EXAM:
LUMBAR SPINE - 2-3 VIEW; DG C-ARM 1-60 MIN

[Series 1: run · 2 of 2 slices shown]
[im 1/2]
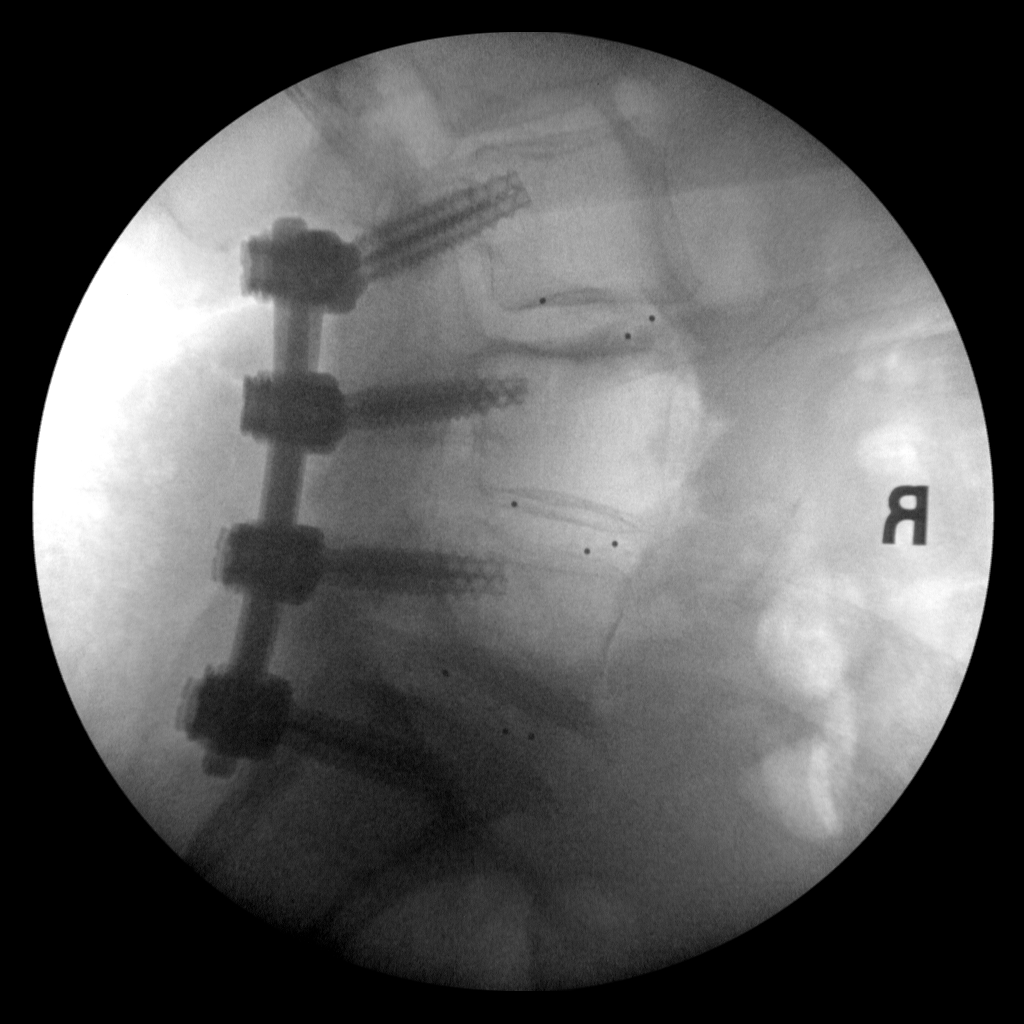
[im 2/2]
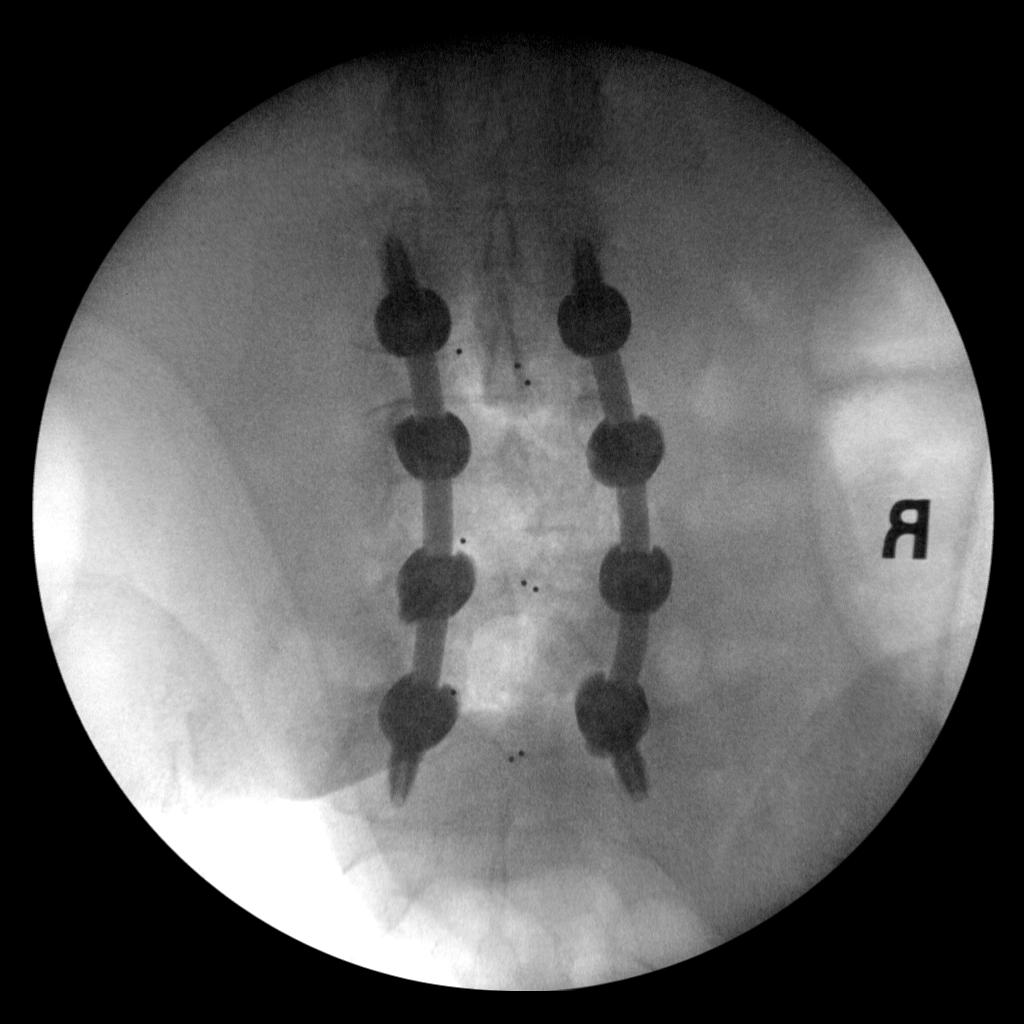

[2 of 2 positions shown; findings below may reference images not displayed]

FINDINGS: Lumbar spine numbered the lowest segmented appearing lumbar shaped
vertebrae on lateral view as L5. L3 through S1 posterior interbody
fusion. Hardware intact. Anatomic alignment.
IMPRESSION: L3 through S1 posterior interbody fusion. Hardware intact. Anatomic
alignment.

## 2021-09-01 IMAGING — CR DG LUMBAR SPINE 1V
1 series · 1 of 1 positions shown · non-contrast
Comparison: No prior.

CLINICAL DATA: Lumbar surgery.

EXAM:
LUMBAR SPINE - 1 VIEW

[xtable lateral]
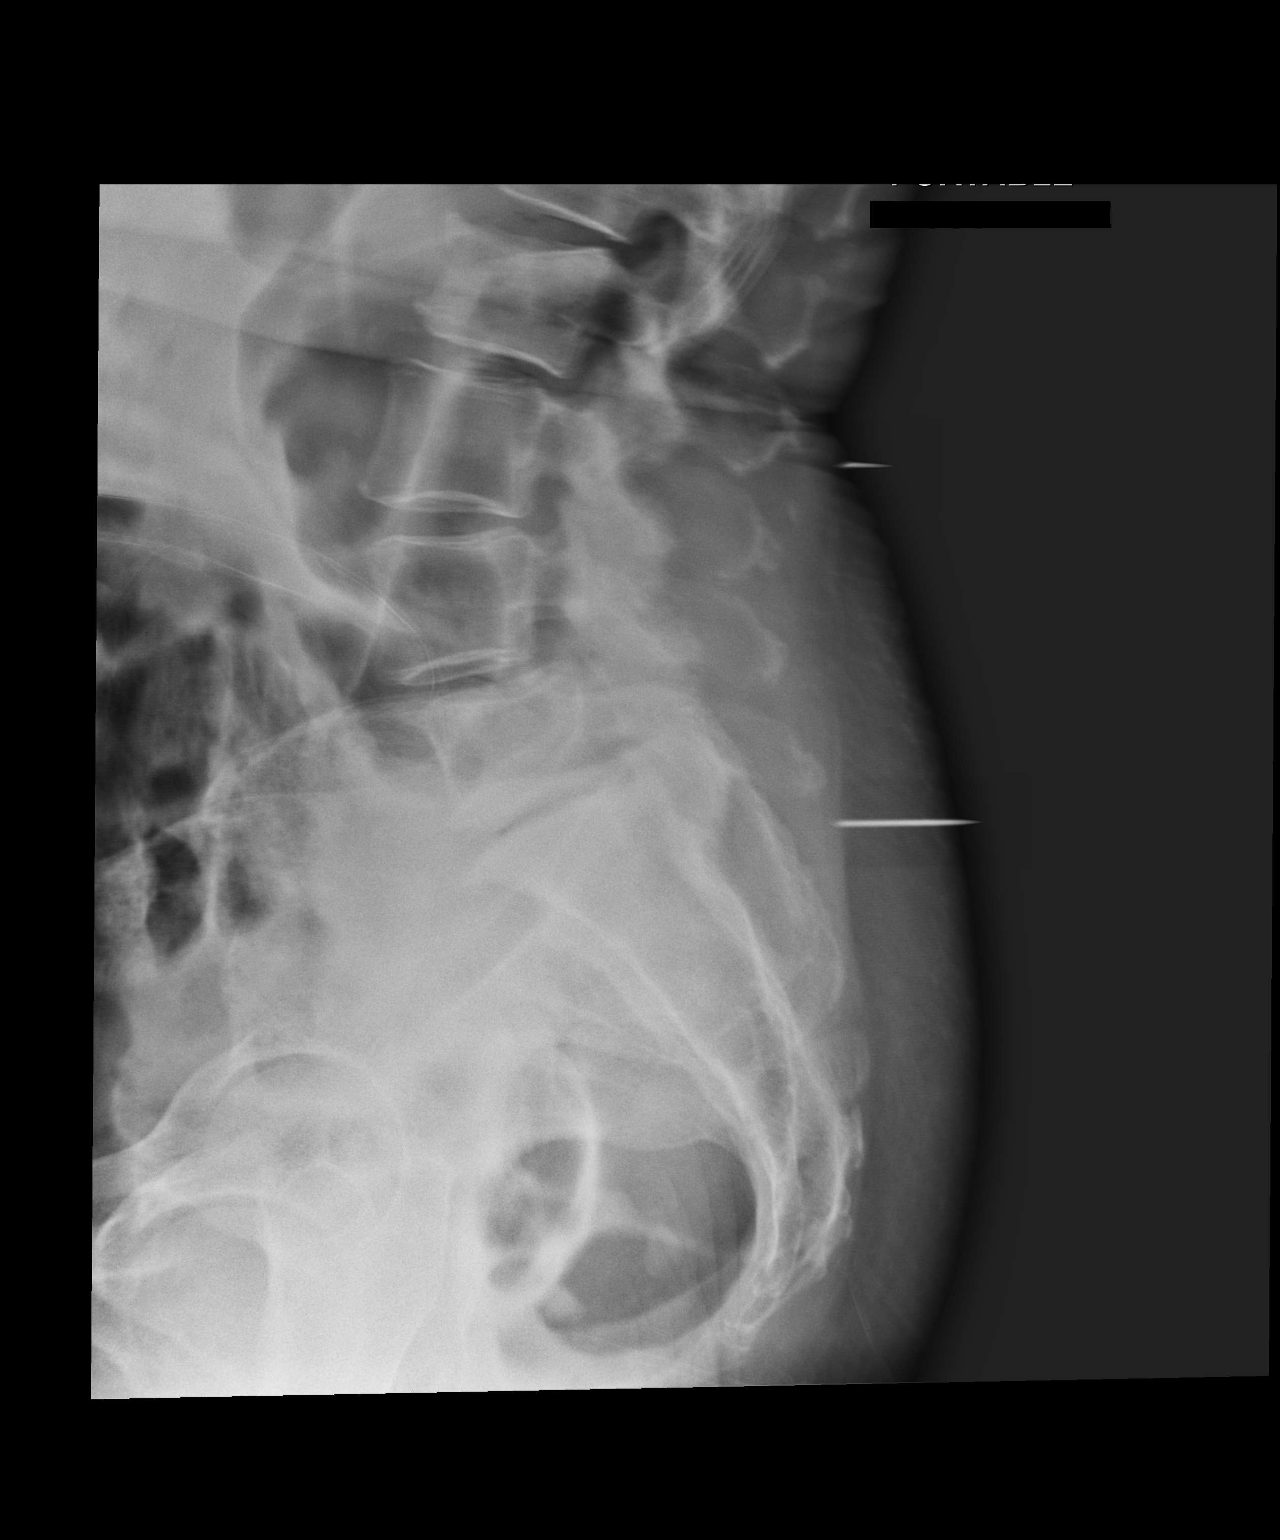

[1 of 1 positions shown; findings below may reference images not displayed]

FINDINGS: Lumbar spine numbered the lowest segmented appearing lumbar shaped
vertebrae on lateral view as L5. Metallic markers noted posteriorly
at the L3 and S1 level diffuse degenerative change. Anterolisthesis
L4 on L5 again noted.
IMPRESSION: 1. Metallic markers noted posteriorly at the L3 and S1 level.
2. Diffuse degenerative change. Anterolisthesis L4 on L5 again
noted.

## 2021-09-08 DIAGNOSIS — Z1331 Encounter for screening for depression: Secondary | ICD-10-CM | POA: Diagnosis not present

## 2021-09-08 DIAGNOSIS — I10 Essential (primary) hypertension: Secondary | ICD-10-CM | POA: Diagnosis not present

## 2021-09-08 DIAGNOSIS — Z136 Encounter for screening for cardiovascular disorders: Secondary | ICD-10-CM | POA: Diagnosis not present

## 2021-09-08 DIAGNOSIS — Z0000 Encounter for general adult medical examination without abnormal findings: Secondary | ICD-10-CM | POA: Diagnosis not present

## 2021-09-08 DIAGNOSIS — G8929 Other chronic pain: Secondary | ICD-10-CM | POA: Diagnosis not present

## 2021-09-08 DIAGNOSIS — Z13220 Encounter for screening for lipoid disorders: Secondary | ICD-10-CM | POA: Diagnosis not present

## 2021-09-08 DIAGNOSIS — M47816 Spondylosis without myelopathy or radiculopathy, lumbar region: Secondary | ICD-10-CM | POA: Diagnosis not present

## 2021-10-02 LAB — GLUCOSE, POCT (MANUAL RESULT ENTRY): POC Glucose: 167 mg/dl — AB (ref 70–99)

## 2021-10-09 DIAGNOSIS — Z23 Encounter for immunization: Secondary | ICD-10-CM | POA: Diagnosis not present

## 2021-10-12 DIAGNOSIS — Z23 Encounter for immunization: Secondary | ICD-10-CM | POA: Diagnosis not present

## 2022-02-24 ENCOUNTER — Other Ambulatory Visit: Payer: Self-pay | Admitting: Internal Medicine

## 2022-02-24 DIAGNOSIS — Z1231 Encounter for screening mammogram for malignant neoplasm of breast: Secondary | ICD-10-CM

## 2022-03-10 DIAGNOSIS — G8929 Other chronic pain: Secondary | ICD-10-CM | POA: Diagnosis not present

## 2022-03-10 DIAGNOSIS — I10 Essential (primary) hypertension: Secondary | ICD-10-CM | POA: Diagnosis not present

## 2022-03-10 DIAGNOSIS — N289 Disorder of kidney and ureter, unspecified: Secondary | ICD-10-CM | POA: Diagnosis not present

## 2022-03-10 DIAGNOSIS — M47816 Spondylosis without myelopathy or radiculopathy, lumbar region: Secondary | ICD-10-CM | POA: Diagnosis not present

## 2022-04-07 ENCOUNTER — Ambulatory Visit
Admission: RE | Admit: 2022-04-07 | Discharge: 2022-04-07 | Disposition: A | Discharge status: Home / Self Care | Payer: Medicare Other | Source: Ambulatory Visit | Attending: Internal Medicine | Admitting: Internal Medicine

## 2022-04-07 DIAGNOSIS — Z1231 Encounter for screening mammogram for malignant neoplasm of breast: Secondary | ICD-10-CM

## 2022-09-07 DIAGNOSIS — H43813 Vitreous degeneration, bilateral: Secondary | ICD-10-CM | POA: Diagnosis not present

## 2022-09-07 DIAGNOSIS — Z961 Presence of intraocular lens: Secondary | ICD-10-CM | POA: Diagnosis not present

## 2022-09-07 DIAGNOSIS — H04123 Dry eye syndrome of bilateral lacrimal glands: Secondary | ICD-10-CM | POA: Diagnosis not present

## 2022-09-07 DIAGNOSIS — H18413 Arcus senilis, bilateral: Secondary | ICD-10-CM | POA: Diagnosis not present

## 2022-09-10 DIAGNOSIS — N393 Stress incontinence (female) (male): Secondary | ICD-10-CM | POA: Diagnosis not present

## 2022-09-10 DIAGNOSIS — M47816 Spondylosis without myelopathy or radiculopathy, lumbar region: Secondary | ICD-10-CM | POA: Diagnosis not present

## 2022-09-10 DIAGNOSIS — D649 Anemia, unspecified: Secondary | ICD-10-CM | POA: Diagnosis not present

## 2022-09-10 DIAGNOSIS — D179 Benign lipomatous neoplasm, unspecified: Secondary | ICD-10-CM | POA: Diagnosis not present

## 2022-09-10 DIAGNOSIS — Z1331 Encounter for screening for depression: Secondary | ICD-10-CM | POA: Diagnosis not present

## 2022-09-10 DIAGNOSIS — N1831 Chronic kidney disease, stage 3a: Secondary | ICD-10-CM | POA: Diagnosis not present

## 2022-09-10 DIAGNOSIS — I10 Essential (primary) hypertension: Secondary | ICD-10-CM | POA: Diagnosis not present

## 2022-09-10 DIAGNOSIS — Z23 Encounter for immunization: Secondary | ICD-10-CM | POA: Diagnosis not present

## 2022-09-10 DIAGNOSIS — Z13220 Encounter for screening for lipoid disorders: Secondary | ICD-10-CM | POA: Diagnosis not present

## 2022-09-10 DIAGNOSIS — Z0000 Encounter for general adult medical examination without abnormal findings: Secondary | ICD-10-CM | POA: Diagnosis not present

## 2022-12-04 IMAGING — MG MM DIGITAL SCREENING BILAT W/ TOMO AND CAD
8 series · 8 of 24 positions shown · non-contrast
Comparison: Previous exam(s).

CLINICAL DATA: Screening.

EXAM:
DIGITAL SCREENING BILATERAL MAMMOGRAM WITH TOMOSYNTHESIS AND CAD
TECHNIQUE: Bilateral screening digital craniocaudal and mediolateral oblique
mammograms were obtained. Bilateral screening digital breast
tomosynthesis was performed. The images were evaluated with
computer-aided detection.

[L CC synth-2D]
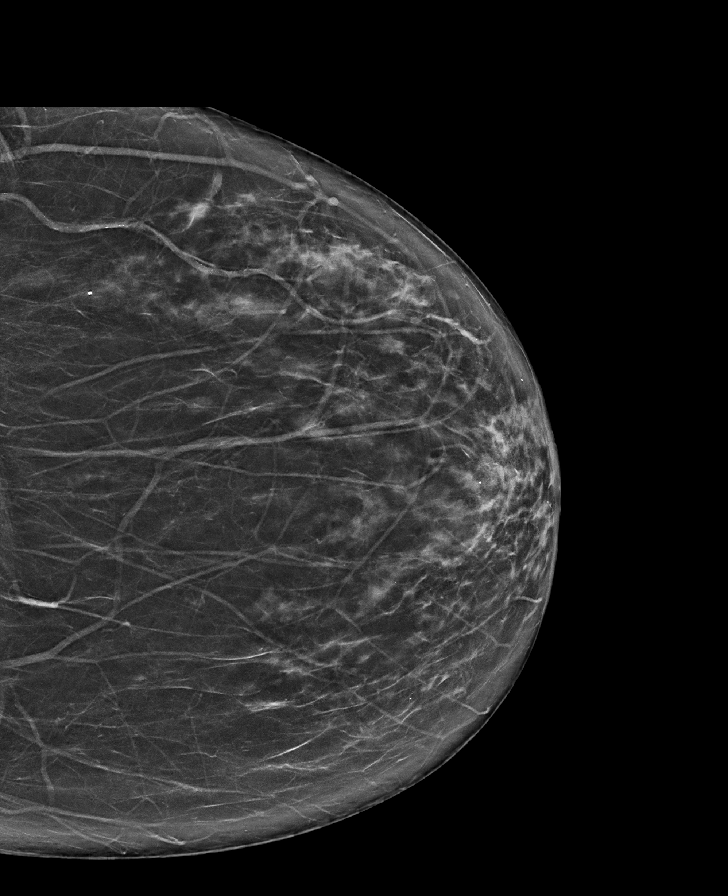

[R CC synth-2D]
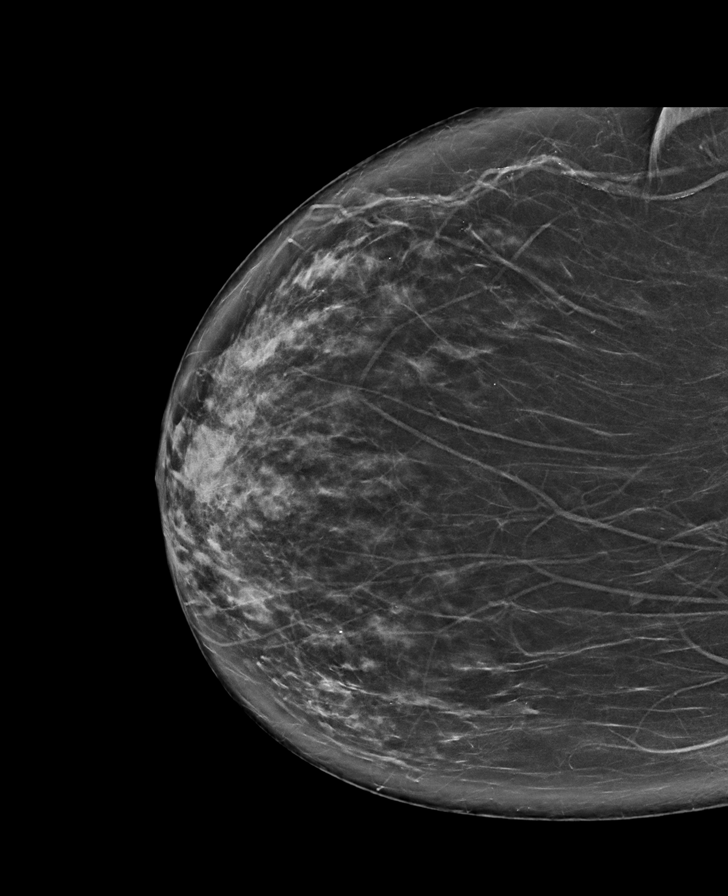

[L MLO synth-2D]
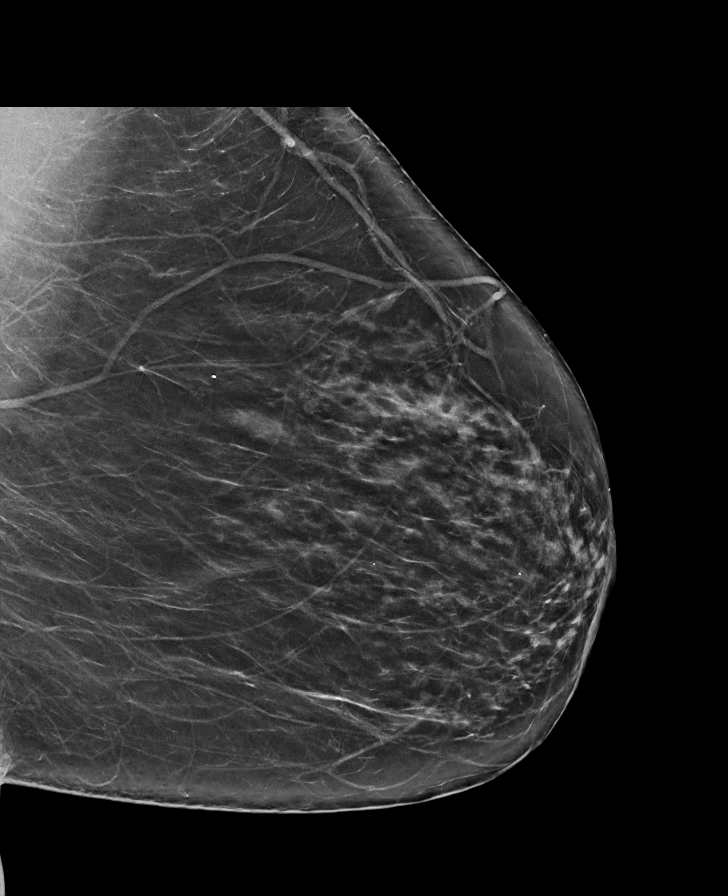

[R MLO synth-2D]
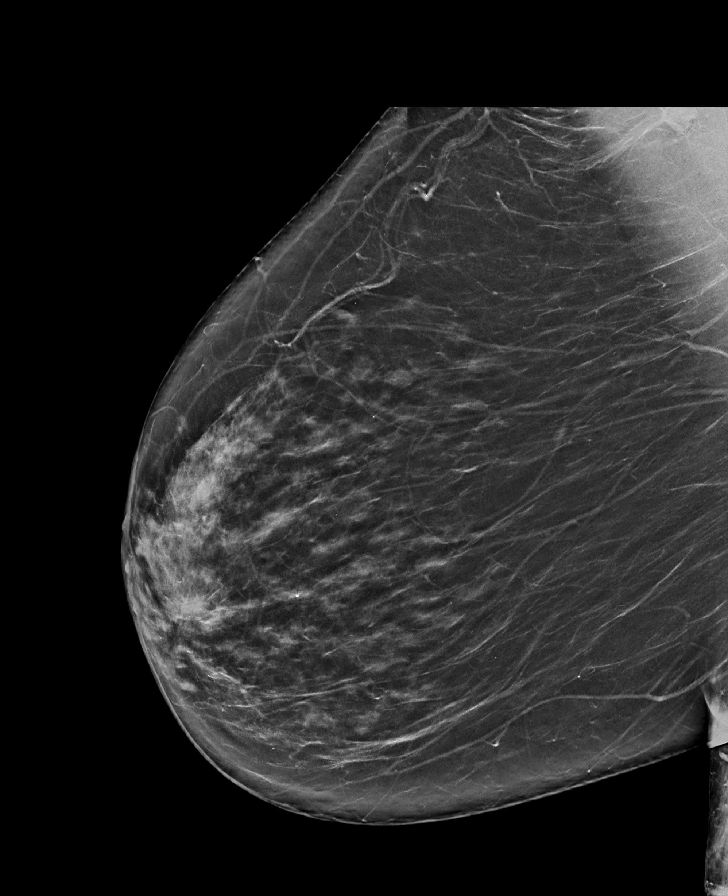

[R MLO tomo · tomo slice 43/86.0]
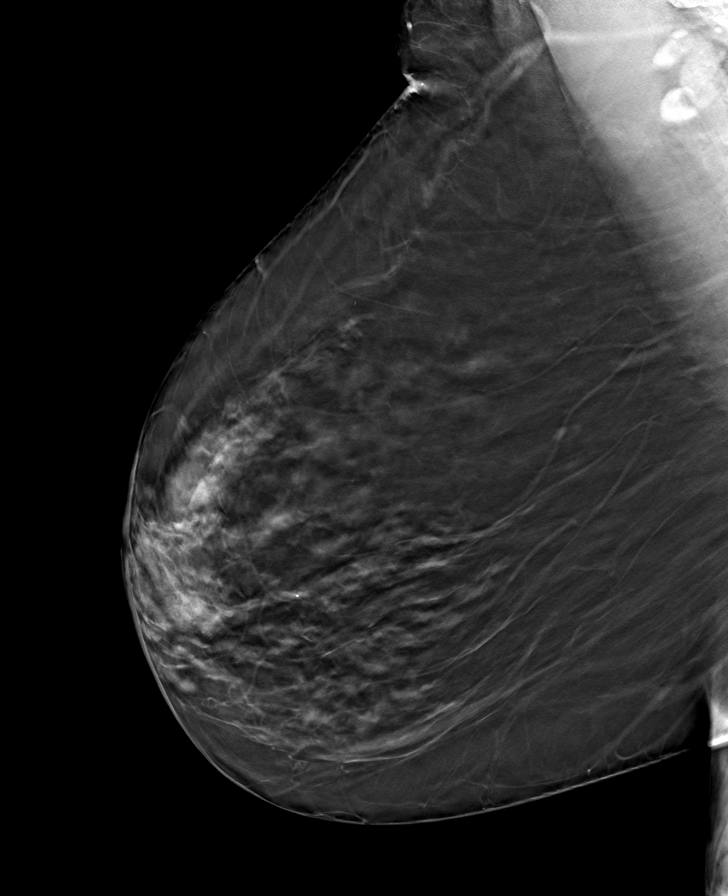

[R CC tomo · tomo slice 41/81.0]
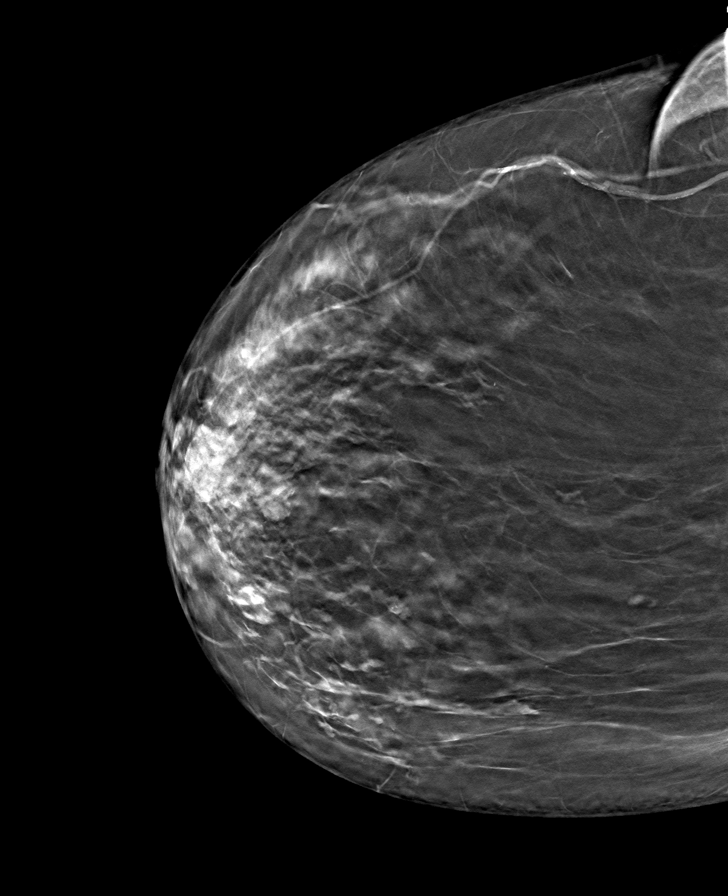

[L CC tomo · tomo slice 39/78.0]
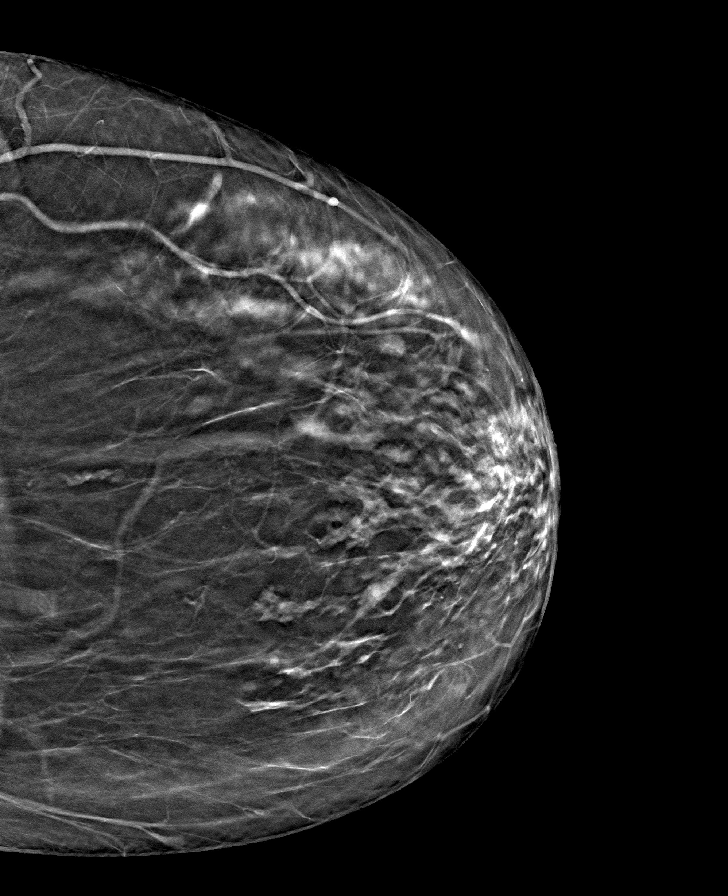

[L MLO tomo · tomo slice 41/80.0]
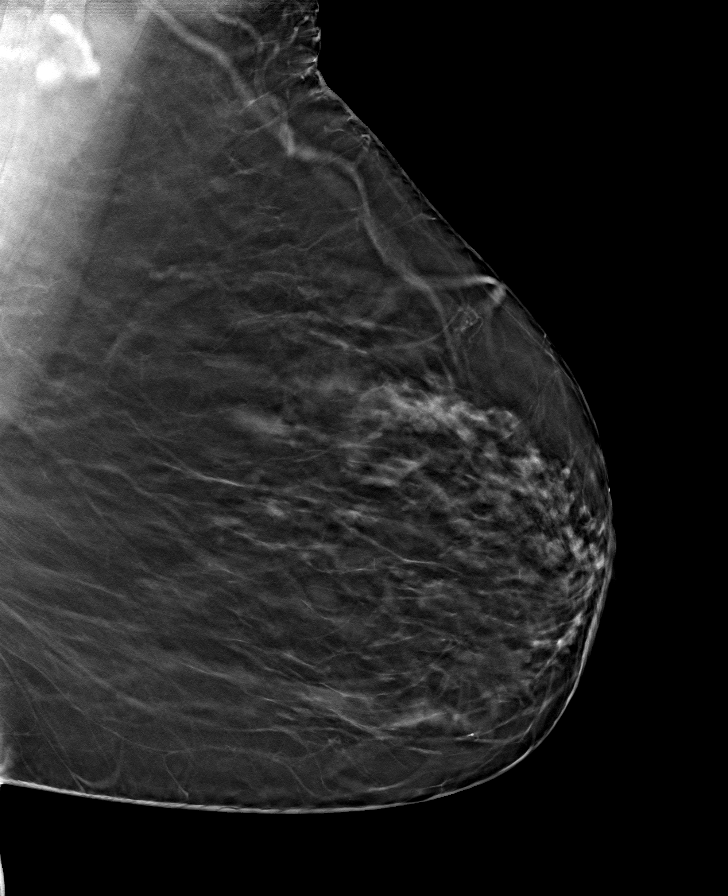

[8 of 24 positions shown; findings below may reference images not displayed]

ACR Breast Density Category b: There are scattered areas of
fibroglandular density.
FINDINGS: There are no findings suspicious for malignancy.
IMPRESSION: No mammographic evidence of malignancy. A result letter of this
screening mammogram will be mailed directly to the patient.

RECOMMENDATION:
Screening mammogram in one year. (Code:51-O-LD2)

BI-RADS CATEGORY  1: Negative.

## 2022-12-13 DIAGNOSIS — M533 Sacrococcygeal disorders, not elsewhere classified: Secondary | ICD-10-CM | POA: Diagnosis not present

## 2022-12-13 DIAGNOSIS — M5416 Radiculopathy, lumbar region: Secondary | ICD-10-CM | POA: Diagnosis not present

## 2022-12-14 DIAGNOSIS — M545 Low back pain, unspecified: Secondary | ICD-10-CM | POA: Diagnosis not present

## 2022-12-31 DIAGNOSIS — M545 Low back pain, unspecified: Secondary | ICD-10-CM | POA: Diagnosis not present

## 2023-01-04 DIAGNOSIS — R262 Difficulty in walking, not elsewhere classified: Secondary | ICD-10-CM | POA: Diagnosis not present

## 2023-01-04 DIAGNOSIS — M25552 Pain in left hip: Secondary | ICD-10-CM | POA: Diagnosis not present

## 2023-01-04 DIAGNOSIS — M79605 Pain in left leg: Secondary | ICD-10-CM | POA: Diagnosis not present

## 2023-01-06 DIAGNOSIS — R262 Difficulty in walking, not elsewhere classified: Secondary | ICD-10-CM | POA: Diagnosis not present

## 2023-01-06 DIAGNOSIS — M79605 Pain in left leg: Secondary | ICD-10-CM | POA: Diagnosis not present

## 2023-01-06 DIAGNOSIS — M25552 Pain in left hip: Secondary | ICD-10-CM | POA: Diagnosis not present

## 2023-04-05 ENCOUNTER — Other Ambulatory Visit: Payer: Self-pay | Admitting: Internal Medicine

## 2023-04-05 DIAGNOSIS — Z1231 Encounter for screening mammogram for malignant neoplasm of breast: Secondary | ICD-10-CM

## 2023-04-08 ENCOUNTER — Ambulatory Visit
Admission: RE | Admit: 2023-04-08 | Discharge: 2023-04-08 | Disposition: A | Discharge status: Home / Self Care | Source: Ambulatory Visit | Attending: Internal Medicine | Admitting: Internal Medicine

## 2023-04-08 DIAGNOSIS — Z1231 Encounter for screening mammogram for malignant neoplasm of breast: Secondary | ICD-10-CM
